# Patient Record
Sex: Female | Born: 2000
Health system: Southern US, Community
[De-identification: ages and names within clinical notes are randomized; demographics above are authoritative.]

## PROBLEM LIST (undated history)

## (undated) DIAGNOSIS — R569 Unspecified convulsions: Secondary | ICD-10-CM

## (undated) DIAGNOSIS — A749 Chlamydial infection, unspecified: Secondary | ICD-10-CM

## (undated) HISTORY — PX: NO PAST SURGERIES: SHX2092

---
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
## 2018-01-13 ENCOUNTER — Ambulatory Visit: Payer: Self-pay | Admitting: Obstetrics

## 2018-01-30 ENCOUNTER — Encounter: Payer: Self-pay | Admitting: Obstetrics

## 2018-01-30 ENCOUNTER — Ambulatory Visit (INDEPENDENT_AMBULATORY_CARE_PROVIDER_SITE_OTHER): Payer: Medicaid Other | Admitting: Obstetrics

## 2018-01-30 ENCOUNTER — Other Ambulatory Visit (HOSPITAL_COMMUNITY)
Admission: RE | Admit: 2018-01-30 | Discharge: 2018-01-30 | Disposition: A | Discharge status: Home / Self Care | Payer: Medicaid Other | Source: Ambulatory Visit | Attending: Obstetrics | Admitting: Obstetrics

## 2018-01-30 VITALS — BP 135/75 | HR 84 | Wt 207.0 lb

## 2018-01-30 DIAGNOSIS — Z113 Encounter for screening for infections with a predominantly sexual mode of transmission: Secondary | ICD-10-CM

## 2018-01-30 DIAGNOSIS — N898 Other specified noninflammatory disorders of vagina: Secondary | ICD-10-CM

## 2018-01-30 DIAGNOSIS — Z3481 Encounter for supervision of other normal pregnancy, first trimester: Secondary | ICD-10-CM | POA: Insufficient documentation

## 2018-01-30 DIAGNOSIS — Z3201 Encounter for pregnancy test, result positive: Secondary | ICD-10-CM

## 2018-01-30 DIAGNOSIS — Z3A12 12 weeks gestation of pregnancy: Secondary | ICD-10-CM

## 2018-01-30 DIAGNOSIS — Z3687 Encounter for antenatal screening for uncertain dates: Secondary | ICD-10-CM

## 2018-01-30 HISTORY — DX: Encounter for supervision of other normal pregnancy, first trimester: Z34.81

## 2018-01-30 LAB — POCT URINE PREGNANCY: Preg Test, Ur: POSITIVE — AB

## 2018-01-30 MED ORDER — PRENATAL GUMMIES/DHA & FA 0.4-32.5 MG PO CHEW: 3.0000 | CHEWABLE_TABLET | Freq: Every day | ORAL | 11 refills | Status: DC

## 2018-01-30 NOTE — Progress Notes (Signed)
Subjective:    Kathy Bell is being seen today for her first obstetrical visit.  This is not a planned pregnancy. She is at 6322w2d gestation. Her obstetrical history is significant for obesity. Relationship with FOB: significant other, not living together. Patient does intend to breast feed. Pregnancy history fully reviewed.  The information documented in the HPI was reviewed and verified.  Menstrual History: OB History    Gravida  1   Para  0   Term  0   Preterm  0   AB  0   Living  0     SAB  0   TAB  0   Ectopic  0   Multiple  0   Live Births  0            Patient's last menstrual period was 10/29/2017 (approximate).    Past Medical History:  Diagnosis Date  . Seizure (HCC)   . Seizures (HCC)     History reviewed. No pertinent surgical history.  (Not in a hospital admission)  No Known Allergies  Social History   Tobacco Use  . Smoking status: Never Smoker  . Smokeless tobacco: Never Used  Substance Use Topics  . Alcohol use: No    Family History  Problem Relation Age of Onset  . Cancer Mother 533       Passed away from breast cancer at 19 years old  . Cancer Maternal Grandmother        Cancer at young age, and then passed away from cancer at 19 years old  . Kidney disease Maternal Grandfather   . Kidney disease Paternal Aunt      Review of Systems Constitutional: negative for weight loss Gastrointestinal: negative for vomiting Genitourinary:negative for genital lesions and vaginal discharge and dysuria Musculoskeletal:negative for back pain Behavioral/Psych: negative for abusive relationship, depression, illegal drug usage and tobacco use    Objective:    BP 135/75   Pulse 84   Wt 207 lb (93.9 kg)   LMP 10/29/2017 (Approximate) Comment: Pt reports it was only two days and it was brown, not red  BMI 36.67 kg/m  General Appearance:    Alert, cooperative, no distress, appears stated age  Head:    Normocephalic, without obvious  abnormality, atraumatic  Eyes:    PERRL, conjunctiva/corneas clear, EOM's intact, fundi    benign, both eyes  Ears:    Normal TM's and external ear canals, both ears  Nose:   Nares normal, septum midline, mucosa normal, no drainage    or sinus tenderness  Throat:   Lips, mucosa, and tongue normal; teeth and gums normal  Neck:   Supple, symmetrical, trachea midline, no adenopathy;    thyroid:  no enlargement/tenderness/nodules; no carotid   bruit or JVD  Back:     Symmetric, no curvature, ROM normal, no CVA tenderness  Lungs:     Clear to auscultation bilaterally, respirations unlabored  Chest Wall:    No tenderness or deformity   Heart:    Regular rate and rhythm, S1 and S2 normal, no murmur, rub   or gallop  Breast Exam:    No tenderness, masses, or nipple abnormality  Abdomen:     Soft, non-tender, bowel sounds active all four quadrants,    no masses, no organomegaly  Genitalia:    Normal female without lesion, discharge or tenderness  Extremities:   Extremities normal, atraumatic, no cyanosis or edema  Pulses:   2+ and symmetric all extremities  Skin:  Skin color, texture, turgor normal, no rashes or lesions  Lymph nodes:   Cervical, supraclavicular, and axillary nodes normal  Neurologic:   CNII-XII intact, normal strength, sensation and reflexes    throughout      Lab Review Urine pregnancy test Labs reviewed yes Radiologic studies reviewed no  Assessment:    Pregnancy at 8078w2d weeks   No FHT's with Doppler.  Informal ultrasound reveals ~ 7 week IUP, FHR 136 bpm   Plan:    Formal ultrasound ordered for dating.  Prenatal vitamins.  Counseling provided regarding continued use of seat belts, cessation of alcohol consumption, smoking or use of illicit drugs; infection precautions i.e., influenza/TDAP immunizations, toxoplasmosis,CMV, parvovirus, listeria and varicella; workplace safety, exercise during pregnancy; routine dental care, safe medications, sexual activity, hot  tubs, saunas, pools, travel, caffeine use, fish and methlymercury, potential toxins, hair treatments, varicose veins Weight gain recommendations per IOM guidelines reviewed: underweight/BMI< 18.5--> gain 28 - 40 lbs; normal weight/BMI 18.5 - 24.9--> gain 25 - 35 lbs; overweight/BMI 25 - 29.9--> gain 15 - 25 lbs; obese/BMI >30->gain  11 - 20 lbs Problem list reviewed and updated. FIRST/CF mutation testing/NIPT/QUAD SCREEN/fragile X/Ashkenazi Jewish population testing/Spinal muscular atrophy discussed: requested. Role of ultrasound in pregnancy discussed; fetal survey: requested. Amniocentesis discussed: not indicated.  No orders of the defined types were placed in this encounter.  Orders Placed This Encounter  Procedures  . Culture, OB Urine  . US OB Comp Less 14 Wks    Standing Status:   Future    Standing Expiration Date:   04/03/2019    Order Specific Question:   Reason for Exam (SYMPTOM  OR DIAGNOSIS REQUIRED)    Answer:   Unsure LMP    Order Specific Question:   Preferred Imaging Location?    Answer:   [facility]  . Hemoglobinopathy evaluation  . Obstetric Panel, Including HIV  . Inheritest(R) CF/SMA Panel  . Genetic Screening  . POCT urine pregnancy    Follow up in 4 weeks. 50% of 25 min visit spent on counseling and coordination of care.     Brock BadHARLES A. Shanikia Kernodle MD 01-30-2018

## 2018-01-31 LAB — CERVICOVAGINAL ANCILLARY ONLY
BACTERIAL VAGINITIS: NEGATIVE
CANDIDA VAGINITIS: NEGATIVE
CHLAMYDIA, DNA PROBE: NEGATIVE
NEISSERIA GONORRHEA: NEGATIVE
TRICH (WINDOWPATH): NEGATIVE

## 2018-02-01 LAB — URINE CULTURE, OB REFLEX

## 2018-02-01 LAB — CULTURE, OB URINE

## 2018-02-05 NOTE — L&D Delivery Note (Signed)
Obstetrical Delivery Note   Date of Delivery:   04/05/2018 Primary OB:   [facility] Gestational Age/EDD: [redacted]w[redacted]d (dated by 8 week ultrasound) Reason for Admission: Preterm labor Antepartum complications: PPROM diagnosed at 16/3 weeks  Delivered By:   Cornelia Copa MD  Delivery Type:   spontaneous vaginal delivery  Delivery Details:   Patient came to [facility] triaged on 2/28 for LOF and dx with PPROM. No s/s of infection or labor so patient discharged to home with outpatient expectant management. Patient came in tonight with abdominal pain and no fetal heart tones noted and feet in the vagina. Patient had uncomplicated vaginal delivery with delivery of the placenta shortly thereafter. Speculum exam negative Anesthesia:    None Intrapartum complications: None GBS:    Not applicable Laceration:    none Episiotomy:    none Rectal exam:   deferred Placenta:    Delivered, intact, and sent to pathology Estimated Blood Loss:   Baby:    Stillborn female, weight not done. Fetus looked appropriately grown for 16 weeks. Grossly normal looking female. +skin sloughing and overlapping sutures  Cornelia Copa MD Attending Center for [facility])

## 2018-02-10 ENCOUNTER — Ambulatory Visit (HOSPITAL_COMMUNITY)
Admission: RE | Admit: 2018-02-10 | Discharge: 2018-02-10 | Disposition: A | Discharge status: Home / Self Care | Payer: Medicaid Other | Source: Ambulatory Visit | Attending: Obstetrics | Admitting: Obstetrics

## 2018-02-10 DIAGNOSIS — Z3687 Encounter for antenatal screening for uncertain dates: Secondary | ICD-10-CM | POA: Diagnosis not present

## 2018-02-10 DIAGNOSIS — Z3481 Encounter for supervision of other normal pregnancy, first trimester: Secondary | ICD-10-CM

## 2018-02-13 LAB — OBSTETRIC PANEL, INCLUDING HIV
Antibody Screen: NEGATIVE
Basophils Absolute: 0 10*3/uL (ref 0.0–0.2)
Basos: 0 %
EOS (ABSOLUTE): 0.1 10*3/uL (ref 0.0–0.4)
Eos: 1 %
HIV Screen 4th Generation wRfx: NONREACTIVE
Hematocrit: 38.3 % (ref 34.0–46.6)
Hemoglobin: 12.5 g/dL (ref 11.1–15.9)
Hepatitis B Surface Ag: NEGATIVE
Immature Grans (Abs): 0 10*3/uL (ref 0.0–0.1)
Immature Granulocytes: 0 %
Lymphocytes Absolute: 2.2 10*3/uL (ref 0.7–3.1)
Lymphs: 25 %
MCH: 27.2 pg (ref 26.6–33.0)
MCHC: 32.6 g/dL (ref 31.5–35.7)
MCV: 83 fL (ref 79–97)
Monocytes Absolute: 0.7 10*3/uL (ref 0.1–0.9)
Monocytes: 8 %
Neutrophils Absolute: 6 10*3/uL (ref 1.4–7.0)
Neutrophils: 66 %
Platelets: 225 10*3/uL (ref 150–450)
RBC: 4.59 x10E6/uL (ref 3.77–5.28)
RDW: 13.4 % (ref 12.3–15.4)
RPR Ser Ql: NONREACTIVE
Rh Factor: POSITIVE
Rubella Antibodies, IGG: 2.65 index (ref >0.99)
WBC: 9 10*3/uL (ref 3.4–10.8)

## 2018-02-13 LAB — HEMOGLOBINOPATHY EVALUATION
HEMOGLOBIN F QUANTITATION: 0 % (ref 0.0–2.0)
HGB C: 0 %
HGB S: 0 %
HGB VARIANT: 0 %
Hemoglobin A2 Quantitation: 2.2 % (ref 1.8–3.2)
Hgb A: 97.8 % (ref 96.4–98.8)

## 2018-02-13 LAB — INHERITEST(R) CF/SMA PANEL

## 2018-02-17 DIAGNOSIS — E669 Obesity, unspecified: Secondary | ICD-10-CM | POA: Diagnosis not present

## 2018-02-17 DIAGNOSIS — Z1388 Encounter for screening for disorder due to exposure to contaminants: Secondary | ICD-10-CM | POA: Diagnosis not present

## 2018-02-17 DIAGNOSIS — Z3009 Encounter for other general counseling and advice on contraception: Secondary | ICD-10-CM | POA: Diagnosis not present

## 2018-02-17 DIAGNOSIS — Z0389 Encounter for observation for other suspected diseases and conditions ruled out: Secondary | ICD-10-CM | POA: Diagnosis not present

## 2018-02-17 DIAGNOSIS — Z3401 Encounter for supervision of normal first pregnancy, first trimester: Secondary | ICD-10-CM | POA: Diagnosis not present

## 2018-02-17 DIAGNOSIS — G40909 Epilepsy, unspecified, not intractable, without status epilepticus: Secondary | ICD-10-CM | POA: Diagnosis not present

## 2018-02-18 ENCOUNTER — Other Ambulatory Visit (HOSPITAL_COMMUNITY): Payer: Self-pay | Admitting: Nurse Practitioner

## 2018-02-18 DIAGNOSIS — Z3682 Encounter for antenatal screening for nuchal translucency: Secondary | ICD-10-CM

## 2018-02-18 DIAGNOSIS — Z3A13 13 weeks gestation of pregnancy: Secondary | ICD-10-CM

## 2018-02-27 ENCOUNTER — Encounter: Payer: Self-pay | Admitting: Certified Nurse Midwife

## 2018-03-04 ENCOUNTER — Encounter (HOSPITAL_COMMUNITY): Payer: Self-pay

## 2018-03-11 ENCOUNTER — Ambulatory Visit (HOSPITAL_COMMUNITY)
Admission: RE | Admit: 2018-03-11 | Discharge: 2018-03-11 | Disposition: A | Discharge status: Home / Self Care | Payer: Medicaid Other | Source: Ambulatory Visit | Attending: Nurse Practitioner | Admitting: Nurse Practitioner

## 2018-03-11 ENCOUNTER — Encounter (HOSPITAL_COMMUNITY): Payer: Self-pay

## 2018-03-11 DIAGNOSIS — Z3A13 13 weeks gestation of pregnancy: Secondary | ICD-10-CM | POA: Diagnosis not present

## 2018-03-11 DIAGNOSIS — G40909 Epilepsy, unspecified, not intractable, without status epilepticus: Secondary | ICD-10-CM | POA: Diagnosis not present

## 2018-03-11 DIAGNOSIS — O99211 Obesity complicating pregnancy, first trimester: Secondary | ICD-10-CM | POA: Diagnosis not present

## 2018-03-11 DIAGNOSIS — Z3682 Encounter for antenatal screening for nuchal translucency: Secondary | ICD-10-CM | POA: Diagnosis not present

## 2018-03-11 DIAGNOSIS — O99350 Diseases of the nervous system complicating pregnancy, unspecified trimester: Secondary | ICD-10-CM | POA: Diagnosis not present

## 2018-03-11 DIAGNOSIS — Z3491 Encounter for supervision of normal pregnancy, unspecified, first trimester: Secondary | ICD-10-CM | POA: Diagnosis not present

## 2018-03-14 ENCOUNTER — Other Ambulatory Visit (HOSPITAL_COMMUNITY): Payer: Self-pay | Admitting: Nurse Practitioner

## 2018-03-19 DIAGNOSIS — E669 Obesity, unspecified: Secondary | ICD-10-CM | POA: Diagnosis not present

## 2018-03-19 DIAGNOSIS — Z3402 Encounter for supervision of normal first pregnancy, second trimester: Secondary | ICD-10-CM | POA: Diagnosis not present

## 2018-03-19 DIAGNOSIS — R824 Acetonuria: Secondary | ICD-10-CM | POA: Diagnosis not present

## 2018-03-19 DIAGNOSIS — G40909 Epilepsy, unspecified, not intractable, without status epilepticus: Secondary | ICD-10-CM | POA: Diagnosis not present

## 2018-03-19 DIAGNOSIS — Z803 Family history of malignant neoplasm of breast: Secondary | ICD-10-CM | POA: Diagnosis not present

## 2018-03-19 DIAGNOSIS — O99320 Drug use complicating pregnancy, unspecified trimester: Secondary | ICD-10-CM | POA: Diagnosis not present

## 2018-04-04 ENCOUNTER — Encounter: Payer: Self-pay | Admitting: Advanced Practice Midwife

## 2018-04-04 ENCOUNTER — Other Ambulatory Visit: Payer: Self-pay

## 2018-04-04 ENCOUNTER — Inpatient Hospital Stay (HOSPITAL_BASED_OUTPATIENT_CLINIC_OR_DEPARTMENT_OTHER): Payer: Medicaid Other

## 2018-04-04 ENCOUNTER — Other Ambulatory Visit: Payer: Self-pay | Admitting: Obstetrics & Gynecology

## 2018-04-04 ENCOUNTER — Inpatient Hospital Stay (HOSPITAL_COMMUNITY)
Admission: EM | Admit: 2018-04-04 | Discharge: 2018-04-04 | Disposition: A | Discharge status: Home / Self Care | Payer: Medicaid Other | Attending: Obstetrics & Gynecology | Admitting: Obstetrics & Gynecology

## 2018-04-04 ENCOUNTER — Encounter (HOSPITAL_COMMUNITY): Payer: Self-pay | Admitting: *Deleted

## 2018-04-04 DIAGNOSIS — O4290 Premature rupture of membranes, unspecified as to length of time between rupture and onset of labor, unspecified weeks of gestation: Secondary | ICD-10-CM | POA: Insufficient documentation

## 2018-04-04 DIAGNOSIS — O4692 Antepartum hemorrhage, unspecified, second trimester: Secondary | ICD-10-CM

## 2018-04-04 DIAGNOSIS — Z3A16 16 weeks gestation of pregnancy: Secondary | ICD-10-CM | POA: Insufficient documentation

## 2018-04-04 DIAGNOSIS — O42912 Preterm premature rupture of membranes, unspecified as to length of time between rupture and onset of labor, second trimester: Secondary | ICD-10-CM

## 2018-04-04 DIAGNOSIS — G40909 Epilepsy, unspecified, not intractable, without status epilepticus: Secondary | ICD-10-CM | POA: Diagnosis not present

## 2018-04-04 DIAGNOSIS — O26892 Other specified pregnancy related conditions, second trimester: Secondary | ICD-10-CM | POA: Diagnosis present

## 2018-04-04 DIAGNOSIS — O99212 Obesity complicating pregnancy, second trimester: Secondary | ICD-10-CM

## 2018-04-04 DIAGNOSIS — O99352 Diseases of the nervous system complicating pregnancy, second trimester: Secondary | ICD-10-CM

## 2018-04-04 DIAGNOSIS — Z3492 Encounter for supervision of normal pregnancy, unspecified, second trimester: Secondary | ICD-10-CM

## 2018-04-04 DIAGNOSIS — O4100X0 Oligohydramnios, unspecified trimester, not applicable or unspecified: Secondary | ICD-10-CM

## 2018-04-04 DIAGNOSIS — Z363 Encounter for antenatal screening for malformations: Secondary | ICD-10-CM

## 2018-04-04 HISTORY — DX: Chlamydial infection, unspecified: A74.9

## 2018-04-04 HISTORY — DX: Oligohydramnios, unspecified trimester, not applicable or unspecified: O41.00X0

## 2018-04-04 HISTORY — DX: Premature rupture of membranes, unspecified as to length of time between rupture and onset of labor, unspecified weeks of gestation: O42.90

## 2018-04-04 NOTE — MAU Note (Signed)
Around 0500, had a warm gush, kept leaking.  About an hour ago when she got up, she noted blood. No pain. Care at [facility].

## 2018-04-04 NOTE — Discharge Instructions (Signed)
Sepsis, Diagnosis, Adult  Sepsis is a serious bodily reaction to an infection. The infection that triggers sepsis may be from a bacteria, virus, or fungus. Sepsis can result from an infection in any part of your body. Infections that commonly lead to sepsis include skin, lung, and urinary tract infections.  Sepsis is a medical emergency that must be treated right away in a hospital. In severe cases, it can lead to septic shock. Septic shock can weaken your heart and cause your blood pressure to drop. This can cause your central nervous system and your body's organs to stop working.  What are the causes?  This condition is caused by a severe reaction to infections from bacteria, viruses, or fungus. The germs that most often lead to sepsis include:  · Escherichia coli (E. coli) bacteria.  · Staphylococcus aureus (staph) bacteria.  · Some types of Streptococcus bacteria.  The most common infections affect these organs:  · The lung (pneumonia).  · The kidneys or bladder (urinary tract infection).  · The skin (cellulitis).  · The bowel, gallbladder, or pancreas.  What increases the risk?  You are more likely to develop this condition if:  · Your body's disease-fighting system (immune system) is weakened.  · You are age 65 or older.  · You are female.  · You had surgery or you have been hospitalized.  · You have these devices inserted into your body:  ? A small, thin tube (catheter).  ? IV line.  ? Breathing tube.  ? Drainage tube.  · You are not getting enough nutrients from food (malnourished).  · You have a long-term (chronic) disease, such as cancer, lung disease, kidney disease, or diabetes.  · You are African American.  What are the signs or symptoms?  Symptoms of this condition may include:  · Fever.  · Chills or feeling very cold.  · Confusion or anxiety.  · Fatigue.  · Muscle aches.  · Shortness of breath.  · Nausea and vomiting.  · Urinating much less than usual.  · Fast heart rate (tachycardia).  · Rapid  breathing (hyperventilation).  · Changes in skin color. Your skin may look blotchy, pale, or blue.  · Cool, clammy, or sweaty skin.  · Skin rash.  Other symptoms depend on the source of your infection.  How is this diagnosed?  This condition is diagnosed based on:  · Your symptoms.  · Your medical history.  · A physical exam.  Other tests may also be done to find out the cause of the infection and how severe the sepsis is. These tests may include:  · Blood tests.  · Urine tests.  · Swabs from other areas of your body that may have an infection. These samples may be tested (cultured) to find out what type of bacteria is causing the infection.  · Chest X-ray to check for pneumonia. Other imaging tests, such as a CT scan, may also be done.  · Lumbar puncture. This removes a small amount of the fluid that surrounds your brain and spinal cord. The fluid is then examined for infection.  How is this treated?  This condition must be treated in a hospital. Based on the cause of your infection, you may be given an antibiotic, antiviral, or antifungal medicine.  You may also receive:  · Fluids through an IV.  · Oxygen and breathing assistance.  · Medicines to increase your blood pressure.  · Kidney dialysis. This process cleans your blood if your   kidneys have failed.  · Surgery to remove infected tissue.  · Blood transfusion if needed.  · Medicine to prevent blood clots.  · Nutrients to correct imbalances in basic body function (metabolism). You may:  ? Receive important salts and minerals (electrolytes) through an IV.  ? Have your blood sugar level adjusted.  Follow these instructions at home:  Medicines    · Take over-the-counter and prescription medicines only as told by your health care provider.  · If you were prescribed an antibiotic, antiviral, or antifungal medicine, take it as told by your health care provider. Do not stop taking the medicine even if you start to feel better.  General instructions  · If you have a  catheter or other indwelling device, ask to have it removed as soon as possible.  · Keep all follow-up visits as told by your health care provider. This is important.  Contact a health care provider if:  · You do not feel like you are getting better or regaining strength.  · You are having trouble coping with your recovery.  · You frequently feel tired.  · You feel worse or do not seem to get better after surgery.  · You think you may have an infection after surgery.  Get help right away if:  · You have any symptoms of sepsis.  · You have difficulty breathing.  · You have a rapid or skipping heartbeat.  · You become confused or disoriented.  · You have a high fever.  · Your skin becomes blotchy, pale, or blue.  · You have an infection that is getting worse or not getting better.  These symptoms may represent a serious problem that is an emergency. Do not wait to see if the symptoms will go away. Get medical help right away. Call your local emergency services (911 in the [place] Do not drive yourself to the hospital.  Summary  · Sepsis is a medical emergency that requires immediate treatment in a hospital.  · This condition is caused by a severe reaction to infections from bacteria, viruses, or fungus.  · Based on the cause of your infection, you may be given an antibiotic, antiviral, or antifungal medicine.  · Treatment may also include IV fluids, breathing assistance, and kidney dialysis.  This information is not intended to replace advice given to you by your health care provider. Make sure you discuss any questions you have with your health care provider.  Document Released: 10/21/2002 Document Revised: 08/30/2017 Document Reviewed: 08/30/2017  [employer]vier Interactive Patient Education © 2019 [employer]

## 2018-04-04 NOTE — MAU Provider Note (Addendum)
History     CSN: [id number]  Arrival date and time: 04/04/18 3704   First Provider Initiated Contact with Patient 04/04/18 1032      Chief Complaint  Patient presents with  . Vaginal Discharge  . Vaginal Bleeding   HPI   Kathy Bell is 18 y.o. G1P0000 female [redacted]w[redacted]d who presents with large gush of fluid at 0500 this morning. She thought she had urinated on herself. She has not had any leaking since. However, a few hours later when she wiped after using the bathroom she noticed bloody mucus on the toilet paper. No blood in the toilet bowl. She denies changes in abdominal pain; has had persistent round ligament pain for the past couple of weeks. Has not yet had her anatomy scan.   OB History    Gravida  1   Para  0   Term  0   Preterm  0   AB  0   Living  0     SAB  0   TAB  0   Ectopic  0   Multiple  0   Live Births  0           Past Medical History:  Diagnosis Date  . Chlamydia   . Seizure (HCC)   . Seizures (HCC)    epileptic, no seizure or meds in ~78yrs    Past Surgical History:  Procedure Laterality Date  . NO PAST SURGERIES      Family History  Problem Relation Age of Onset  . Cancer Mother 46       Passed away from breast cancer at 20 years old  . Cancer Maternal Grandmother        Cancer at young age, and then passed away from cancer at 20 years old  . Healthy Father   . Kidney disease Maternal Grandfather   . Kidney disease Paternal Aunt     Social History   Tobacco Use  . Smoking status: Never Smoker  . Smokeless tobacco: Never Used  Substance Use Topics  . Alcohol use: No  . Drug use: No    Allergies: No Known Allergies  Medications Prior to Admission  Medication Sig Dispense Refill Last Dose  . Prenatal MV-Min-FA-Omega-3 (PRENATAL GUMMIES/DHA & FA) 0.4-32.5 MG CHEW Chew 3 tablets by mouth daily. 90 tablet 11 Taking    Review of Systems  Constitutional: Negative for chills and fever.  Gastrointestinal: Negative for  abdominal pain, diarrhea, nausea and vomiting.  Genitourinary: Positive for vaginal bleeding and vaginal discharge. Negative for difficulty urinating, dysuria, hematuria and pelvic pain.  Musculoskeletal: Negative for back pain.   Physical Exam   Blood pressure 124/64, pulse 92, temperature 98.4 F (36.9 C), temperature source Oral, resp. rate 18, last menstrual period 10/29/2017.  Physical Exam  Constitutional: She is oriented to person, place, and time. She appears well-developed and well-nourished. No distress.  HENT:  Head: Normocephalic and atraumatic.  Eyes: Conjunctivae and EOM are normal.  Neck: Normal range of motion. Neck supple.  Cardiovascular: Normal rate and regular rhythm.  Respiratory: Effort normal and breath sounds normal. No respiratory distress.  GI: Soft. She exhibits no distension. There is no abdominal tenderness. There is no rebound and no guarding.  Musculoskeletal: Normal range of motion.  Neurological: She is alert and oriented to person, place, and time.  Skin: Skin is warm and dry. She is not diaphoretic.  Psychiatric: She has a normal mood and affect. Her behavior is normal.   FHT:  146 bpm   MAU Course/MDM  Procedures  Patient Vitals for the past 24 hrs:  BP Temp Temp src Pulse Resp  04/04/18 1020 124/64 98.4 F (36.9 C) Oral 92 18    .  Assessment and Plan    De Hollingshead 04/04/2018, 10:32 AM    Care assumed from Dr. Earlene Plater at 1032.  Reita Cliche, CNM MDM:  --Ultrasound results reviewed with Dr. Judeth Cornfield (MFM) and Dr. Debroah Loop (Faculty Attending) --Signs of sepsis and possible outcomes discussed at length with patient  Patient Vitals for the past 24 hrs:  BP Temp Temp src Pulse Resp  04/04/18 1430 138/63 - - 97 -  04/04/18 1020 124/64 98.4 F (36.9 C) Oral 92 18    [place] Mfm Ob Detail +14 Wk  Result Date: 04/04/2018 ----------------------------------------------------------------------  OBSTETRICS REPORT                       (Signed  Final 04/04/2018 11:52 am) ---------------------------------------------------------------------- Patient Info  ID #:       [id number]                          D.O.B.:  Jun 20, 2000 (17 yrs)  Name:       Kathy Bell                Visit Date: 04/04/2018 10:28 am ---------------------------------------------------------------------- Performed By  Performed By:     Tomma Lightning             Ref. Address:     [address]                    [facility]                                                             [address]                                                             [address]                                                             [place]                                                             [address]  Attending:        Noralee Space MD        [facility]  Referred By:      Center for                    [facility]                    [facility] - [facility] ---------------------------------------------------------------------- Orders   #  Description                          Code         Ordered By   1  [place] MFM OB DETAIL +14 WK              [id number]     Marcy Siren  ----------------------------------------------------------------------   #  Order #                    Accession #                 Episode #   1  [id number]                  [id number]                  [id number]  ---------------------------------------------------------------------- Indications   Vaginal bleeding in pregnancy, second          O46.92   trimester   [redacted] weeks gestation of pregnancy                Z3A.16   Seizure disorder                               O99.350 G40.909   Obesity complicating pregnancy, second         O99.212   trimester   Encounter for antenatal screening for          Z36.3   malformations  ---------------------------------------------------------------------- Vital  Signs  Weight (lb): 203                               Height:        5'3"  BMI:         35.96 ---------------------------------------------------------------------- Fetal Evaluation  Num Of Fetuses:         1  Fetal Heart Rate(bpm):  152  Cardiac Activity:       Observed  Presentation:           Transverse, head to maternal right  Placenta:               Anterior, low-lying, 0.6 cm from int os  P. Cord Insertion:      Not well visualized  Amniotic Fluid  AFI FV:      Anhydramnios ---------------------------------------------------------------------- Biometry  BPD:      33.7  mm     G. Age:  16w 3d         48  %    CI:        70.18   %    70 - 86  FL/HC:      17.4   %    13.3 - 16.5  HC:      128.3  mm     G. Age:  16w 4d         43  %    HC/AC:      1.24        1.05 - 1.39  AC:      103.7  mm     G. Age:  16w 3d         48  %    FL/BPD:     66.2   %  FL:       22.3  mm     G. Age:  16w 5d         55  %    FL/AC:      21.5   %    20 - 24  HUM:        21  mm     G. Age:  16w 2d         51  %  CER:      15.2  mm     G. Age:  15w 1d         22  %  LV:        5.5  mm  CM:        5.1  mm  Est. FW:     159  gm      0 lb 6 oz     63  % ---------------------------------------------------------------------- OB History  Gravidity:    1         Term:   0        Prem:   0        SAB:   0  TOP:          0       Ectopic:  0        Living: 0 ---------------------------------------------------------------------- Gestational Age  LMP:           22w 3d        Date:  10/29/17                 EDD:   08/05/18  U/S Today:     16w 4d                                        EDD:   09/15/18  Best:          16w 3d     Det. By:  Marcella Dubs         EDD:   09/16/18                                      (02/10/18) ---------------------------------------------------------------------- Anatomy  Cranium:               Appears normal         Aortic Arch:            Not well visualized   Cavum:                 Appears normal         Ductal Arch:  Appears normal  Ventricles:            Appears normal         Diaphragm:              Not well visualized  Choroid Plexus:        Appears normal         Stomach:                Appears normal, left                                                                        sided  Cerebellum:            Appears normal         Abdomen:                Appears normal  Posterior Fossa:       Appears normal         Abdominal Wall:         Not well visualized  Nuchal Fold:           Not well visualized    Cord Vessels:           Appears normal (3                                                                        vessel cord)  Face:                  Not well visualized    Kidneys:                Appear normal  Lips:                  Not well visualized    Bladder:                Appears normal  Palate:                Not well visualized    Spine:                  Not well visualized  Heart:                 Not well visualized    Upper Extremities:      RT Vis; Lt R/U not                                                                        well vis  RVOT:                  Not well visualized    Lower Extremities:  RT Vis; Lt T/F not                                                                        well vis  LVOT:                  Not well visualized  Other:  Technically difficult due to low amniotic fluid and fetal position. ---------------------------------------------------------------------- Cervix Uterus Adnexa  Cervix  Length:            3.5  cm.  Normal appearance by transabdominal scan.  Uterus  No abnormality visualized.  Left Ovary  Within normal limits.  Right Ovary  Within normal limits. ---------------------------------------------------------------------- Impression  Patient is being evaluated in the MAU for c/o leakage of  amniotic fluid.  On ultrasound, no measurable pocket of amniotic fluid is  seen (anhydramnios). Fetal biometry  is consistent with her  previously-established dates and good fetal heart activity is  seen. Fetal anatomical survey is very limited because of  anhydramnios.Placenta is anterior and low lying.  Impression: Very early PPROM.  Very early PPROM can be associated with pulmonary  hypoplasia and poor neonatal outcome. She has an  increased risk of miscarriage. Role of antibiotics is not clear  and is not indicated if she does not have infection now.  Patient has an option of termination of pregnancy. If she is  planning to continue her pregnancy, weekly fetal heat checks  at your office is sufficient. Recommend readmission at 23 to  24 weeks for steroids and management till delivery. ----------------------------------------------------------------------                  Noralee Space, MD Electronically Signed Final Report   04/04/2018 11:52 am ----------------------------------------------------------------------   A/P: --18 y.o. G1P0000 at [redacted]w[redacted]d  --PROM, Anhydramnios --FHT 146 by Doppler --Discharge home in stable condition  F/U:  --Per consult with Dr. Debroah Loop, patient to have ultrasound next week,  --[facility] messaged to schedule visit no more than two weeks --[place] ordered, patient knows to expect call. [facility] clinic messaged to schedule visit  Clayton Bibles, [place] 04/04/18  3:06 PM

## 2018-04-05 ENCOUNTER — Inpatient Hospital Stay (HOSPITAL_COMMUNITY)
Admission: AD | Admit: 2018-04-05 | Discharge: 2018-04-06 | DRG: 779 | Disposition: A | Discharge status: Home / Self Care | Payer: Medicaid Other | Attending: Obstetrics and Gynecology | Admitting: Obstetrics and Gynecology

## 2018-04-05 DIAGNOSIS — Z3A16 16 weeks gestation of pregnancy: Secondary | ICD-10-CM | POA: Diagnosis not present

## 2018-04-05 DIAGNOSIS — O42112 Preterm premature rupture of membranes, onset of labor more than 24 hours following rupture, second trimester: Secondary | ICD-10-CM

## 2018-04-05 DIAGNOSIS — O42012 Preterm premature rupture of membranes, onset of labor within 24 hours of rupture, second trimester: Secondary | ICD-10-CM | POA: Diagnosis not present

## 2018-04-05 DIAGNOSIS — O411220 Chorioamnionitis, second trimester, not applicable or unspecified: Secondary | ICD-10-CM | POA: Diagnosis not present

## 2018-04-05 DIAGNOSIS — O42919 Preterm premature rupture of membranes, unspecified as to length of time between rupture and onset of labor, unspecified trimester: Secondary | ICD-10-CM | POA: Diagnosis present

## 2018-04-05 DIAGNOSIS — O021 Missed abortion: Principal | ICD-10-CM | POA: Diagnosis present

## 2018-04-05 LAB — CBC
HEMATOCRIT: 36.1 % (ref 36.0–46.0)
Hemoglobin: 12 g/dL (ref 12.0–15.0)
MCH: 28.6 pg (ref 26.0–34.0)
MCHC: 33.2 g/dL (ref 30.0–36.0)
MCV: 86.2 fL (ref 80.0–100.0)
Platelets: 174 10*3/uL (ref 150–400)
RBC: 4.19 MIL/uL (ref 3.87–5.11)
RDW: 12.8 % (ref 11.5–15.5)
WBC: 16 10*3/uL — ABNORMAL HIGH (ref 4.0–10.5)
nRBC: 0 % (ref 0.0–0.2)

## 2018-04-05 LAB — TYPE AND SCREEN
ABO/RH(D): B POS
Antibody Screen: NEGATIVE

## 2018-04-05 MED ORDER — LACTATED RINGERS IV SOLN: 500.0000 mL | INTRAVENOUS | Status: DC | PRN

## 2018-04-05 MED ORDER — METHYLERGONOVINE MALEATE 0.2 MG/ML IJ SOLN
0.2000 mg | Freq: Once | INTRAMUSCULAR | Status: AC
  Administered 2018-04-05: 0.2 mg via INTRAMUSCULAR
  Filled 2018-04-05: qty 1

## 2018-04-05 MED ORDER — LACTATED RINGERS IV SOLN
INTRAVENOUS | Status: DC
  Administered 2018-04-06: 01:00:00 via INTRAVENOUS

## 2018-04-05 MED ORDER — FENTANYL CITRATE (PF) 100 MCG/2ML IJ SOLN
50.0000 ug | INTRAMUSCULAR | Status: DC | PRN
  Administered 2018-04-05: 50 ug via INTRAVENOUS
  Filled 2018-04-05: qty 2

## 2018-04-05 NOTE — MAU Provider Note (Signed)
Chief Complaint: Abdominal Pain   None     SUBJECTIVE HPI: Kathy Bell is a 18 y.o. G1P0000 at [redacted]w[redacted]d who presents to maternity admissions reporting increasing lower abdominal pain and pelvic pressure. Pain is intermittent, cramping, worsening over time, radiates to her low back.  She was diagnosed with previable PPROM on 04/04/18.  There are no other symptoms. She has not tried any treatments.    HPI  Past Medical History:  Diagnosis Date  . Chlamydia   . Seizure (HCC)   . Seizures (HCC)    epileptic, no seizure or meds in ~35yrs   Past Surgical History:  Procedure Laterality Date  . NO PAST SURGERIES     Social History   Socioeconomic History  . Marital status: Single    Spouse name: Not on file  . Number of children: Not on file  . Years of education: 82  . Highest education level: Not on file  Occupational History  . 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 Financial resource strain: Very hard  . Food insecurity:    Worry: Sometimes true    Inability: Often true  . Transportation needs:    Medical: Yes    Non-medical: Yes  Tobacco Use  . Smoking status: Never Smoker  . Smokeless tobacco: Never Used  Substance and Sexual Activity  . Alcohol use: No  . Drug use: No  . Sexual activity: Yes    Partners: Male  Lifestyle  . Physical activity:    Days per week: 6 days    Minutes per session: 150+ min  . Stress: To some extent  Relationships  . Social connections:    Talks on phone: More than three times a week    Gets together: Three times a week    Attends religious service: More than 4 times per year    Active member of club or organization: Yes    Attends meetings of clubs or organizations: More than 4 times per year    Relationship status: Living with partner  . Intimate partner violence:    Fear of current or ex partner: No    Emotionally abused: No    Physically abused: No    Forced sexual activity: No   Other Topics Concern  . Not on file  Social History Narrative  . Not on file   No current facility-administered medications on file prior to encounter.    Current Outpatient Medications on File Prior to Encounter  Medication Sig Dispense Refill  . Prenatal MV-Min-FA-Omega-3 (PRENATAL GUMMIES/DHA & FA) 0.4-32.5 MG CHEW Chew 3 tablets by mouth daily. 90 tablet 11   No Known Allergies  ROS:  Review of Systems  Constitutional: Negative for chills, fatigue and fever.  Respiratory: Negative for shortness of breath.   Cardiovascular: Negative for chest pain.  Gastrointestinal: Positive for abdominal pain.  Genitourinary: Positive for pelvic pain, vaginal bleeding and vaginal discharge. Negative for difficulty urinating, dysuria, flank pain and vaginal pain.  Neurological: Negative for dizziness and headaches.  Psychiatric/Behavioral: Negative.      I have reviewed patient's Past Medical Hx, Surgical Hx, Family Hx, Social Hx, medications and allergies.   Physical Exam  No data found. Constitutional: Well-developed, well-nourished female in no acute distress.  Cardiovascular: normal rate Respiratory: normal effort GI: Abd soft, non-tender. Pos BS x 4 MS: Extremities nontender, no edema, normal ROM Neurologic: Alert and oriented x 4.  GU: Neg CVAT.  PELVIC  EXAM: SSE with fetal head noted through cervix.  SVE with fetal head and most of torso through open cervix.     LAB RESULTS No results found for this or any previous visit (from the past 24 hour(s)).  B/Positive/-- (12/26 1344)  IMAGING   MAU Management/MDM: Orders Placed This Encounter  Procedures  . CBC  . RPR  . Type and screen [facility]    Meds ordered this encounter  Medications  . lactated ringers infusion  . lactated ringers infusion 500-1,000 mL    Discussed findings with pt and family who responded with appropriate mourning for loss.  Pt admitted for imminent previable delivery.  IV  started and IV pain medications ordered.     ASSESSMENT 1. Preterm premature rupture of membranes (PPROM) with onset of labor after 24 hours of rupture in second trimester, antepartum   2. Preterm labor in second trimester with preterm delivery in second trimester, single or unspecified fetus     PLAN Admit to HROB Unit LR, Fentanyl    Sharen Counter Certified Nurse-Midwife 04/05/2018  10:41 PM

## 2018-04-05 NOTE — MAU Note (Signed)
Pt called MAU and had questions about follow up and discharge papers. Advised pt that if she has any increased pain, bleeding, s/s of infection to come back to MAU for evaluation. Pt verbalized understanding and was appreciative of information.

## 2018-04-05 NOTE — H&P (Addendum)
Obstetrics & Gynecology H&P   Date of Admission: 04/05/2018   Requesting Provider: MAU  Primary OBGYN: [facility] Primary Care Provider: Leland Her  Reason for Admission: PTL, PPROM  History of Present Illness: Kathy Bell is a 18 y.o. G1P0010 (Patient's last menstrual period was 10/29/2017 (approximate).), with the above CC. PMHx is significant for nothing.  Pt dx with pprom yesterday and no s/s of labor or infection so exp management as outpatient done. Pt came in with more pain and CNM exam felt feet in the vagina and unable to get heart tones.    I delivered the baby and the placenta w/o issue.     ROS: A 12-point review of systems was performed and negative, except as stated in the above HPI.  OBGYN History: As per HPI. OB History  Gravida Para Term Preterm AB Living  1 0 0 0 0 0  SAB TAB Ectopic Multiple Live Births  0 0 0 0 0    # Outcome Date GA Lbr Len/2nd Weight Sex Delivery Anes PTL Lv  1 Current               Past Medical History: Past Medical History:  Diagnosis Date  . Chlamydia   . Seizure (HCC)   . Seizures (HCC)    epileptic, no seizure or meds in ~31yrs    Past Surgical History: Past Surgical History:  Procedure Laterality Date  . NO PAST SURGERIES      Family History:  Family History  Problem Relation Age of Onset  . Cancer Mother 24       Passed away from breast cancer at 20 years old  . Cancer Maternal Grandmother        Cancer at young age, and then passed away from cancer at 20 years old  . Healthy Father   . Kidney disease Maternal Grandfather   . Kidney disease Paternal Aunt     Social History:  Social History   Socioeconomic History  . Marital status: Single    Spouse name: Not on file  . Number of children: Not on file  . Years of education: 28  . Highest education level: Not on file  Occupational History  . 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 Financial resource strain: Very  hard  . Food insecurity:    Worry: Sometimes true    Inability: Often true  . Transportation needs:    Medical: Yes    Non-medical: Yes  Tobacco Use  . Smoking status: Never Smoker  . Smokeless tobacco: Never Used  Substance and Sexual Activity  . Alcohol use: No  . Drug use: No  . Sexual activity: Yes    Partners: Male  Lifestyle  . Physical activity:    Days per week: 6 days    Minutes per session: 150+ min  . Stress: To some extent  Relationships  . Social connections:    Talks on phone: More than three times a week    Gets together: Three times a week    Attends religious service: More than 4 times per year    Active member of club or organization: Yes    Attends meetings of clubs or organizations: More than 4 times per year    Relationship status: Living with partner  . Intimate partner violence:    Fear of current or ex partner: No    Emotionally abused: No    Physically  abused: No    Forced sexual activity: No  Other Topics Concern  . Not on file  Social History Narrative  . Not on file    Allergy: No Known Allergies  Current Outpatient Medications: Medications Prior to Admission  Medication Sig Dispense Refill Last Dose  . Prenatal MV-Min-FA-Omega-3 (PRENATAL GUMMIES/DHA & FA) 0.4-32.5 MG CHEW Chew 3 tablets by mouth daily. 90 tablet 11 Taking     Hospital Medications: Current Facility-Administered Medications  Medication Dose Route Frequency Provider Last Rate Last Dose  . fentaNYL (SUBLIMAZE) injection 50 mcg  50 mcg Intravenous Q1H PRN Chester Bing, MD      . lactated ringers infusion 500-1,000 mL  500-1,000 mL Intravenous PRN Leftwich-Kirby, Wilmer Floor, CNM      . lactated ringers infusion   Intravenous Continuous Leftwich-Kirby, Wilmer Floor, CNM      . methylergonovine (METHERGINE) injection 0.2 mg  0.2 mg Intramuscular Once [place] Bing, MD         Physical Exam: See flowsheet There is no height or weight on file to calculate BMI. General  appearance: Well nourished, well developed female in no acute distress.  Respiratory:  . Normal respiratory effort Abdomen: nd, nttp Neuro/Psych:  Normal mood and affect.  Skin:  Warm and dry.  Extremities: no clubbing, cyanosis, or edema.   Pelvic exam: negative after placental delivery.   Laboratory:  Recent Labs  Lab 04/05/18 2237  WBC 16.0*  HGB 12.0  HCT 36.1  PLT 174   Pending: RPR  Assessment: Kathy Bell is a 18 y.o. G1P0010 s/p SVD in OB triage. Pt doing well  Plan: Admit overnight. Placenta to pathology. Will get uds, gc/ct, tsh, hiv. Likely home tomorrow.   Cornelia Copa MD Attending Center for [facility])

## 2018-04-05 NOTE — MAU Note (Signed)
Pt c/o increased pain. PPROM 2 days ago. Assisted pt to bed

## 2018-04-06 ENCOUNTER — Encounter (HOSPITAL_COMMUNITY): Payer: Self-pay | Admitting: *Deleted

## 2018-04-06 DIAGNOSIS — O42919 Preterm premature rupture of membranes, unspecified as to length of time between rupture and onset of labor, unspecified trimester: Secondary | ICD-10-CM

## 2018-04-06 DIAGNOSIS — Z3A16 16 weeks gestation of pregnancy: Secondary | ICD-10-CM | POA: Diagnosis not present

## 2018-04-06 DIAGNOSIS — O42012 Preterm premature rupture of membranes, onset of labor within 24 hours of rupture, second trimester: Secondary | ICD-10-CM | POA: Diagnosis not present

## 2018-04-06 DIAGNOSIS — O021 Missed abortion: Secondary | ICD-10-CM | POA: Diagnosis present

## 2018-04-06 DIAGNOSIS — O42912 Preterm premature rupture of membranes, unspecified as to length of time between rupture and onset of labor, second trimester: Secondary | ICD-10-CM | POA: Diagnosis present

## 2018-04-06 HISTORY — DX: Preterm premature rupture of membranes, unspecified as to length of time between rupture and onset of labor, unspecified trimester: O42.919

## 2018-04-06 LAB — HIV ANTIBODY (ROUTINE TESTING W REFLEX): HIV Screen 4th Generation wRfx: NONREACTIVE

## 2018-04-06 LAB — ABO/RH: ABO/RH(D): B POS

## 2018-04-06 LAB — RPR: RPR Ser Ql: NONREACTIVE

## 2018-04-06 LAB — TSH: TSH: 1.016 u[IU]/mL (ref 0.350–4.500)

## 2018-04-06 MED ORDER — ACETAMINOPHEN 325 MG PO TABS: 650.0000 mg | ORAL_TABLET | ORAL | Status: DC | PRN

## 2018-04-06 MED ORDER — OXYTOCIN BOLUS FROM INFUSION: 500.0000 mL | Freq: Once | INTRAVENOUS | Status: DC

## 2018-04-06 MED ORDER — ONDANSETRON HCL 4 MG/2ML IJ SOLN: 4.0000 mg | Freq: Four times a day (QID) | INTRAMUSCULAR | Status: DC | PRN

## 2018-04-06 MED ORDER — OXYTOCIN 40 UNITS IN NORMAL SALINE INFUSION - SIMPLE MED: 2.5000 [IU]/h | INTRAVENOUS | Status: DC

## 2018-04-06 MED ORDER — DIBUCAINE 1 % RE OINT: 1.0000 "application " | TOPICAL_OINTMENT | RECTAL | Status: DC | PRN

## 2018-04-06 MED ORDER — ONDANSETRON HCL 4 MG PO TABS: 4.0000 mg | ORAL_TABLET | ORAL | Status: DC | PRN

## 2018-04-06 MED ORDER — OXYCODONE-ACETAMINOPHEN 5-325 MG PO TABS: 2.0000 | ORAL_TABLET | ORAL | Status: DC | PRN

## 2018-04-06 MED ORDER — TETANUS-DIPHTH-ACELL PERTUSSIS 5-2.5-18.5 LF-MCG/0.5 IM SUSP: 0.5000 mL | Freq: Once | INTRAMUSCULAR | Status: DC

## 2018-04-06 MED ORDER — SOD CITRATE-CITRIC ACID 500-334 MG/5ML PO SOLN: 30.0000 mL | ORAL | Status: DC | PRN

## 2018-04-06 MED ORDER — IBUPROFEN 600 MG PO TABS
600.0000 mg | ORAL_TABLET | Freq: Four times a day (QID) | ORAL | Status: DC
  Administered 2018-04-06 (×2): 600 mg via ORAL
  Filled 2018-04-06 (×2): qty 1

## 2018-04-06 MED ORDER — DIPHENHYDRAMINE HCL 25 MG PO CAPS: 25.0000 mg | ORAL_CAPSULE | Freq: Four times a day (QID) | ORAL | Status: DC | PRN

## 2018-04-06 MED ORDER — OXYCODONE-ACETAMINOPHEN 5-325 MG PO TABS: 1.0000 | ORAL_TABLET | ORAL | Status: DC | PRN

## 2018-04-06 MED ORDER — ONDANSETRON HCL 4 MG/2ML IJ SOLN: 4.0000 mg | INTRAMUSCULAR | Status: DC | PRN

## 2018-04-06 MED ORDER — LIDOCAINE HCL (PF) 1 % IJ SOLN: 30.0000 mL | INTRAMUSCULAR | Status: DC | PRN

## 2018-04-06 MED ORDER — FENTANYL CITRATE (PF) 100 MCG/2ML IJ SOLN: 100.0000 ug | INTRAMUSCULAR | Status: DC | PRN

## 2018-04-06 MED ORDER — BENZOCAINE-MENTHOL 20-0.5 % EX AERO: 1.0000 "application " | INHALATION_SPRAY | CUTANEOUS | Status: DC | PRN

## 2018-04-06 MED ORDER — ACETAMINOPHEN 325 MG PO TABS
650.0000 mg | ORAL_TABLET | ORAL | Status: DC | PRN
  Administered 2018-04-06: 650 mg via ORAL
  Filled 2018-04-06: qty 2

## 2018-04-06 MED ORDER — SIMETHICONE 80 MG PO CHEW: 80.0000 mg | CHEWABLE_TABLET | ORAL | Status: DC | PRN

## 2018-04-06 MED ORDER — WITCH HAZEL-GLYCERIN EX PADS: 1.0000 "application " | MEDICATED_PAD | CUTANEOUS | Status: DC | PRN

## 2018-04-06 MED ORDER — IBUPROFEN 600 MG PO TABS: 600.0000 mg | ORAL_TABLET | Freq: Four times a day (QID) | ORAL | 0 refills | Status: DC

## 2018-04-06 MED ORDER — OXYCODONE HCL 5 MG PO TABS
5.0000 mg | ORAL_TABLET | Freq: Four times a day (QID) | ORAL | Status: DC | PRN
  Administered 2018-04-06: 5 mg via ORAL
  Filled 2018-04-06: qty 1

## 2018-04-06 MED ORDER — COCONUT OIL OIL: 1.0000 "application " | TOPICAL_OIL | Status: DC | PRN

## 2018-04-06 MED ORDER — ZOLPIDEM TARTRATE 5 MG PO TABS: 5.0000 mg | ORAL_TABLET | Freq: Every evening | ORAL | Status: DC | PRN

## 2018-04-06 NOTE — Discharge Summary (Signed)
Obstetrical Discharge Summary  Date of Admission: 04/05/2018 Date of Discharge: 04/06/2018  Primary OB: [facility]  Gestational Age at Delivery: [redacted]w[redacted]d   Antepartum complications: PPROM at 16/3 weeks Reason for Admission: PTL Date of Delivery: 04/05/2018  Delivered By: Cornelia Copa MD Delivery Type: spontaneous vaginal delivery Intrapartum complications/course: None Anesthesia: none Placenta: Delivered and expressed. Intact: yes. To pathology: yes.  Laceration: none Episiotomy: none EBL: Baby: Stillborn female, grossly normal (see delivery note).    Discharge Diagnosis: Delivered.   Postpartum course: Uncomplicated.  Labs: negative HIV, TSH, GC/CT, RPR. B positive Pending: placenta  Discharge Vital Signs:  Current Vital Signs 24h Vital Sign Ranges  T 98.7 F (37.1 C) Temp  Avg: 99 F (37.2 C)  Min: 98.5 F (36.9 C)  Max: 99.9 F (37.7 C)  BP (!) 124/51 BP  Min: 124/51  Max: 137/51  HR (!) 103 Pulse  Avg: 101.7  Min: 88  Max: 112  RR 17 Resp  Avg: 18.3  Min: 17  Max: 20  SaO2 100 %   SpO2  Avg: 99.3 %  Min: 98 %  Max: 100 %       24 Hour I/O Current Shift I/O  Time Ins Outs 02/29 0701 - 03/01 0700 In: -  Out: 375 [Urine:375] No intake/output data recorded.   Discharge Exam:  General: NAD Abdomen: soft  Recent Labs  Lab 04/05/18 2237  WBC 16.0*  HGB 12.0  HCT 36.1  PLT 174    Disposition: Home  Rh Immune globulin given: not applicable  Contraception: not discussed with patient   Plan:  Kathy Bell was discharged to home in good condition. Patient leaning towards cremation and burial of remains.  Follow-up appointment with [facility] in 10 week for a mood check visit  Future Appointments  Date Time Provider Department Center  04/14/2018 11:30 AM [facility] [place] 1 [facility]  04/14/2018  1:30 PM Hermina Staggers, MD [facility] None    Discharge Medications: Allergies as of 04/06/2018   No Known Allergies     Medication List    TAKE these  medications   acetaminophen 325 MG tablet Commonly known as:  TYLENOL Take 2 tablets (650 mg total) by mouth every 4 (four) hours as needed (for pain scale < 4).   ibuprofen 600 MG tablet Commonly known as:  ADVIL,MOTRIN Take 1 tablet (600 mg total) by mouth every 6 (six) hours.   PRENATAL GUMMIES/DHA & FA 0.4-32.5 MG Chew Chew 3 tablets by mouth daily.       Cornelia Copa MD Attending Center for [facility])

## 2018-04-06 NOTE — Discharge Instructions (Signed)
Vaginal Delivery, Care After Refer to this sheet in the next few weeks. These discharge instructions provide you with information on caring for yourself after delivery. Your caregiver may also give you specific instructions. Your treatment has been planned according to the most current medical practices available, but problems sometimes occur. Call your caregiver if you have any problems or questions after you go home. HOME CARE INSTRUCTIONS 1. Take over-the-counter or prescription medicines only as directed by your caregiver or pharmacist. 2. Do not drink alcohol, especially if you are breastfeeding or taking medicine to relieve pain. 3. Do not smoke tobacco. 4. Continue to use good perineal care. Good perineal care includes: 1. Wiping your perineum from back to front 2. Keeping your perineum clean. 3. You can do sitz baths twice a day, to help keep this area clean 5. Do not use tampons, douche or have sex until your caregiver says it is okay. 6. Shower only and avoid sitting in submerged water, aside from sitz baths 7. Wear a well-fitting bra that provides breast support. 8. Eat healthy foods. 9. Drink enough fluids to keep your urine clear or pale yellow. 10. Eat high-fiber foods such as whole grain cereals and breads, brown rice, beans, and fresh fruits and vegetables every day. These foods may help prevent or relieve constipation. 11. Avoid constipation with high fiber foods or medications, such as miralax or metamucil 12. Follow your caregiver's recommendations regarding resumption of activities such as climbing stairs, driving, lifting, exercising, or traveling. 13. Talk to your caregiver about resuming sexual activities. Resumption of sexual activities is dependent upon your risk of infection, your rate of healing, and your comfort and desire to resume sexual activity. 14. Try to have someone help you with your household activities and your newborn for at least a few days after you leave  the hospital. 15. Rest as much as possible. Try to rest or take a nap when your newborn is sleeping. 16. Increase your activities gradually. 17. Keep all of your scheduled postpartum appointments. It is very important to keep your scheduled follow-up appointments. At these appointments, your caregiver will be checking to make sure that you are healing physically and emotionally. SEEK MEDICAL CARE IF:   You are passing large clots from your vagina. Save any clots to show your caregiver.  You have a foul smelling discharge from your vagina.  You have trouble urinating.  You are urinating frequently.  You have pain when you urinate.  You have a change in your bowel movements.  You have increasing redness, pain, or swelling near your vaginal incision (episiotomy) or vaginal tear.  You have pus draining from your episiotomy or vaginal tear.  Your episiotomy or vaginal tear is separating.  You have painful, hard, or reddened breasts.  You have a severe headache.  You have blurred vision or see spots.  You feel sad or depressed.  You have thoughts of hurting yourself or your newborn.  You have questions about your care, the care of your newborn, or medicines.  You are dizzy or light-headed.  You have a rash.  You have nausea or vomiting.  You were breastfeeding and have not had a menstrual period within 12 weeks after you stopped breastfeeding.  You are not breastfeeding and have not had a menstrual period by the 12th week after delivery.  You have a fever. SEEK IMMEDIATE MEDICAL CARE IF:   You have persistent pain.  You have chest pain.  You have shortness of breath.    You faint.  You have leg pain.  You have stomach pain.  Your vaginal bleeding saturates two or more sanitary pads in 1 hour. MAKE SURE YOU:   Understand these instructions.  Will watch your condition.  Will get help right away if you are not doing well or get worse. Document Released:  01/20/2000 Document Revised: 06/08/2013 Document Reviewed: 09/19/2011 ExitCare Patient Information 2015 [employer]. This information is not intended to replace advice given to you by your health care provider. Make sure you discuss any questions you have with your health care provider.  Sitz Bath A sitz bath is a warm water bath taken in the sitting position. The water covers only the hips and butt (buttocks). We recommend using one that fits in the toilet, to help with ease of use and cleanliness. It may be used for either healing or cleaning purposes. Sitz baths are also used to relieve pain, itching, or muscle tightening (spasms). The water may contain medicine. Moist heat will help you heal and relax.  HOME CARE  Take 3 to 4 sitz baths a day. 18. Fill the bathtub half-full with warm water. 19. Sit in the water and open the drain a little. 20. Turn on the warm water to keep the tub half-full. Keep the water running constantly. 21. Soak in the water for 15 to 20 minutes. 22. After the sitz bath, pat the affected area dry. GET HELP RIGHT AWAY IF: You get worse instead of better. Stop the sitz baths if you get worse. MAKE SURE YOU:  Understand these instructions.  Will watch your condition.  Will get help right away if you are not doing well or get worse. Document Released: 03/01/2004 Document Revised: 10/17/2011 Document Reviewed: 05/22/2010 ExitCare Patient Information 2015 [employer]. This information is not intended to replace advice given to you by your health care provider. Make sure you discuss any questions you have with your health care provider.    

## 2018-04-06 NOTE — Progress Notes (Signed)
Comfort Care completed and Comfort box given to mother and significant other.

## 2018-04-06 NOTE — MAU Note (Signed)
Dr. Vergie Living at bedside. Pt pushing .  Double footing Breech presentation noted. Pt nauseated and vomited aprox 300 cc yellow emesis. Cleaned patient up and continued to assist in breathing and pushing.  2324 complete delivery of fetus. No signs of life. Pt wanting to hold baby. S.O. and family at bedside for support. Pt grieving appropriately. Post partum checks started after placenta out at 2335.

## 2018-04-06 NOTE — Progress Notes (Signed)
Chaplain responded to a page for support for a patient and family for fetal demise. Chaplin offer spiritual support and prayer. Chaplin encourage the family to think of the baby as a living spirit and to allow the baby to live through their thoughts and prayers. Chaplain let family know that chaplain is available for follow up.    04/06/18 0000  Clinical Encounter Type  Visited With Patient and family together  Visit Type Critical Care;Death  Referral From Nurse  Spiritual Encounters  Spiritual Needs Prayer;Emotional

## 2018-04-07 LAB — GC/CHLAMYDIA PROBE AMP (~~LOC~~) NOT AT ARMC
Chlamydia: NEGATIVE
Neisseria Gonorrhea: NEGATIVE

## 2018-04-14 ENCOUNTER — Ambulatory Visit (HOSPITAL_COMMUNITY): Payer: Medicaid Other

## 2018-04-14 ENCOUNTER — Ambulatory Visit: Payer: Medicaid Other | Admitting: Obstetrics

## 2018-04-14 ENCOUNTER — Encounter: Payer: Medicaid Other | Admitting: Obstetrics and Gynecology

## 2018-04-16 ENCOUNTER — Ambulatory Visit: Payer: Medicaid Other | Admitting: Obstetrics

## 2018-04-21 ENCOUNTER — Ambulatory Visit (INDEPENDENT_AMBULATORY_CARE_PROVIDER_SITE_OTHER): Payer: Medicaid Other | Admitting: Obstetrics

## 2018-04-21 ENCOUNTER — Other Ambulatory Visit: Payer: Self-pay

## 2018-04-21 ENCOUNTER — Encounter: Payer: Self-pay | Admitting: Obstetrics

## 2018-04-21 VITALS — BP 134/85 | HR 82 | Resp 16 | Ht 63.0 in | Wt 201.2 lb

## 2018-04-21 DIAGNOSIS — O039 Complete or unspecified spontaneous abortion without complication: Secondary | ICD-10-CM | POA: Diagnosis not present

## 2018-04-21 DIAGNOSIS — O99345 Other mental disorders complicating the puerperium: Secondary | ICD-10-CM

## 2018-04-21 DIAGNOSIS — F53 Postpartum depression: Secondary | ICD-10-CM | POA: Diagnosis not present

## 2018-04-21 NOTE — Progress Notes (Signed)
Patient ID: Kathy Bell, female   DOB: 18-Aug-2000, 18 y.o.   MRN: [id number]  Chief Complaint  Patient presents with  . Follow-up    Mood check    HPI Kathy Bell is a 18 y.o. female.  Patient presents for 2 week follow up after PPROM and SAB at [redacted] weeks gestation.  No complaints.  Still feeling appropriately a little sad, but feels that she is dealing with the situation.  She does request counseling. HPI  Past Medical History:  Diagnosis Date  . Chlamydia   . Seizure ([facility])   . Seizures ([facility])    epileptic, no seizure or meds in ~73yrs    Past Surgical History:  Procedure Laterality Date  . NO PAST SURGERIES      Family History  Problem Relation Age of Onset  . Cancer Mother 46       Passed away from breast cancer at 20 years old  . Cancer Maternal Grandmother        Cancer at young age, and then passed away from cancer at 20 years old  . Healthy Father   . Kidney disease Maternal Grandfather   . Kidney disease Paternal Aunt     Social History Social History   Tobacco Use  . Smoking status: Never Smoker  . Smokeless tobacco: Never Used  Substance Use Topics  . Alcohol use: No  . Drug use: No    No Known Allergies  Current Outpatient Medications  Medication Sig Dispense Refill  . acetaminophen (TYLENOL) 325 MG tablet Take 2 tablets (650 mg total) by mouth every 4 (four) hours as needed (for pain scale < 4).    . ibuprofen (ADVIL,MOTRIN) 600 MG tablet Take 1 tablet (600 mg total) by mouth every 6 (six) hours. 30 tablet 0  . Prenatal MV-Min-FA-Omega-3 (PRENATAL GUMMIES/DHA & FA) 0.4-32.5 MG CHEW Chew 3 tablets by mouth daily. 90 tablet 11   No current facility-administered medications for this visit.     Review of Systems Review of Systems Constitutional: negative for fatigue and weight loss Respiratory: negative for cough and wheezing Cardiovascular: negative for chest pain, fatigue and palpitations Gastrointestinal: negative for abdominal pain and  change in bowel habits Genitourinary:negative Integument/breast: negative for nipple discharge Musculoskeletal:negative for myalgias Neurological: negative for gait problems and tremors Behavioral/Psych: positive for appropriate sadness after SAB Endocrine: negative for temperature intolerance      Blood pressure 134/85, pulse 82, resp. rate 16, height 5\' 3"  (1.6 m), weight 201 lb 3.2 oz (91.3 kg), last menstrual period 10/29/2017.  Physical Exam Physical Exam:  Deferred  50% of 15 min visit spent on counseling and coordination of care.   Data Reviewed Labs Pathology  Assessment     1. SAB (spontaneous abortion) after PPROM at [redacted] weeks gestation - doing well  2. Postpartum depression Rx: - Ambulatory referral to [facility]    Plan    Follow up in 4 weeks  Orders Placed This Encounter  Procedures  . Ambulatory referral to [facility]    Referral Priority:   Routine    Referral Type:   Consultation    Referral Reason:   Specialty Services Required    Number of Visits Requested:   1   No orders of the defined types were placed in this encounter.    Brock Bad MD 05-01-2018

## 2018-05-19 ENCOUNTER — Ambulatory Visit: Payer: Medicaid Other | Admitting: Obstetrics

## 2018-06-05 DIAGNOSIS — F99 Mental disorder, not otherwise specified: Secondary | ICD-10-CM | POA: Diagnosis not present

## 2018-07-17 ENCOUNTER — Ambulatory Visit: Payer: Medicaid Other | Admitting: Obstetrics

## 2018-08-19 ENCOUNTER — Ambulatory Visit
Admission: EM | Admit: 2018-08-19 | Discharge: 2018-08-19 | Disposition: A | Discharge status: Home / Self Care | Payer: Medicaid Other | Attending: Family Medicine | Admitting: Family Medicine

## 2018-08-19 ENCOUNTER — Encounter: Payer: Self-pay | Admitting: Emergency Medicine

## 2018-08-19 ENCOUNTER — Other Ambulatory Visit: Payer: Self-pay

## 2018-08-19 DIAGNOSIS — R1030 Lower abdominal pain, unspecified: Secondary | ICD-10-CM

## 2018-08-19 DIAGNOSIS — M545 Low back pain, unspecified: Secondary | ICD-10-CM

## 2018-08-19 DIAGNOSIS — R110 Nausea: Secondary | ICD-10-CM | POA: Diagnosis not present

## 2018-08-19 LAB — POCT URINALYSIS DIP (MANUAL ENTRY)
Bilirubin, UA: NEGATIVE
Glucose, UA: NEGATIVE mg/dL
Ketones, POC UA: NEGATIVE mg/dL
Leukocytes, UA: NEGATIVE
Nitrite, UA: NEGATIVE
Spec Grav, UA: >=1.03 — AB (ref 1.010–1.025)
Urobilinogen, UA: 0.2 E.U./dL
pH, UA: 6 (ref 5.0–8.0)

## 2018-08-19 LAB — POCT URINE PREGNANCY: Preg Test, Ur: NEGATIVE

## 2018-08-19 MED ORDER — IBUPROFEN 800 MG PO TABS: 800.0000 mg | ORAL_TABLET | Freq: Three times a day (TID) | ORAL | 0 refills | Status: DC

## 2018-08-19 MED ORDER — KETOROLAC TROMETHAMINE 60 MG/2ML IM SOLN
60.0000 mg | Freq: Once | INTRAMUSCULAR | Status: AC
  Administered 2018-08-19: 60 mg via INTRAMUSCULAR

## 2018-08-19 MED ORDER — ONDANSETRON 4 MG PO TBDP
4.0000 mg | ORAL_TABLET | Freq: Once | ORAL | Status: AC
  Administered 2018-08-19: 4 mg via ORAL

## 2018-08-19 NOTE — ED Notes (Signed)
Patient able to ambulate independently  

## 2018-08-19 NOTE — ED Provider Notes (Signed)
[facility]   [id number] 08/19/18 Arrival Time: 1330  ASSESSMENT & PLAN:  1. Lower abdominal pain   2. Acute bilateral low back pain without sciatica   3. Nausea without vomiting    UPT negative. Urine without signs of infection. Hematuria secondary to current menstrual cycle starting today. Responded well to IM Toradol and Zofran-ODT. Feeling better and prefers to go home and rest.  Benign abdominal exam. No indications for urgent abdominal/pelvic imaging at this time. Discussed.  Meds ordered this encounter  Medications  . ketorolac (TORADOL) injection 60 mg  . ondansetron (ZOFRAN-ODT) disintegrating tablet 4 mg  . ibuprofen (ADVIL) 800 MG tablet    Sig: Take 1 tablet (800 mg total) by mouth 3 (three) times daily with meals.    Dispense:  21 tablet    Refill:  0     Discharge Instructions     You have been seen today for abdominal pain. Your evaluation was not suggestive of any emergent condition requiring medical intervention at this time. However, some abdominal problems make take more time to appear. Therefore, it is very important for you to pay attention to any new symptoms or worsening of your current condition.  Please return here or to the Emergency Department immediately should you begin to feel worse in any way or have any of the following symptoms: increasing or different abdominal pain, persistent vomiting, inability to drink fluids, fevers, or shaking chills.      Follow-up Information    [facility].   Specialty: Emergency Medicine Why: If symptoms worsen in any way. Contact information: [address] [id number] [place] [id number] [phone]          Reviewed expectations re: course of current medical issues. Questions answered. Outlined signs and symptoms indicating need for more acute intervention. Patient verbalized understanding. After Visit Summary given.    SUBJECTIVE: History from: patient. Kathy Bell is a 18 y.o. female who presents with complaint of persistent lower abdominal discomfort. Onset abrupt, today. Has noticed mild to moderate lower back discomfort over the past several days; 'usually feel that right before my period starts'. Reports menstrual cycle has been regular for the past few months; miscarriage in 03/2018. Reports she is one week late but vaginal bleeding started this morning. Abdominal discomfort described as aching and cramping; without radiation. Symptoms are unchanged since beginning. Fever: absent. Aggravating factors: have not been identified. Alleviating factors: have not been identified. Associated symptoms: fatigue. She denies arthralgias, belching, chills, constipation, diarrhea, dysuria, headache, myalgias and sweats. Does report nausea with 1-2 episodes of emesis; 'not a lot'. Appetite: decreased. PO intake: decreased. Ambulatory without assistance. Urinary symptoms: none. Bowel movements: have not significantly changed; last bowel movement within the past 1-2 days and without blood. OTC treatment: none.  Patient's last menstrual period was 08/19/2018.   Past Surgical History:  Procedure Laterality Date  . NO PAST SURGERIES      ROS: As per HPI. All other systems negative.  OBJECTIVE:  Vitals:   08/19/18 1344  BP: (!) 136/94  Pulse: 90  Resp: 18  Temp: 98.7 F (37.1 C)  TempSrc: Oral  SpO2: 92%    General appearance: alert, oriented, no acute distress  HEENT: [place]; AT Lungs: clear to auscultation bilaterally; unlabored respirations Heart: regular rate and rhythm Abdomen: soft; without distention; mild and poorly localized tenderness over lower abdomen, R>L; normal bowel sounds; without masses or organomegaly; without guarding or rebound tenderness Back: without CVA tenderness;  FROM at waist Extremities: without LE edema; symmetrical; without gross deformities Skin: warm and dry Neurologic: normal  gait Psychological: alert and cooperative; normal mood and affect  Labs: Results for orders placed or performed during the hospital encounter of 08/19/18  POCT urine pregnancy  Result Value Ref Range   Preg Test, Ur Negative Negative  POCT urinalysis dipstick  Result Value Ref Range   Color, UA yellow yellow   Clarity, UA clear clear   Glucose, UA negative negative mg/dL   Bilirubin, UA negative negative   Ketones, POC UA negative negative mg/dL   Spec Grav, UA >=1.030 (A) 1.010 - 1.025   Blood, UA moderate (A) negative   pH, UA 6.0 5.0 - 8.0   Protein Ur, POC trace (A) negative mg/dL   Urobilinogen, UA 0.2 0.2 or 1.0 E.U./dL   Nitrite, UA Negative Negative   Leukocytes, UA Negative Negative   Labs Reviewed  POCT URINALYSIS DIP (MANUAL ENTRY) - Abnormal; Notable for the following components:      Result Value   Spec Grav, UA >=1.030 (*)    Blood, UA moderate (*)    Protein Ur, POC trace (*)    All other components within normal limits  POCT URINE PREGNANCY - Normal     No Known Allergies                                             Past Medical History:  Diagnosis Date  . Chlamydia   . Seizure ([place])   . Seizures ([place])    epileptic, no seizure or meds in ~109yrs   Social History   Socioeconomic History  . Marital status: Single    Spouse name: Not on file  . Number of children: Not on file  . Years of education: 55  . Highest education level: Not on file  Occupational History  . Occupation: Best boy: [employer]    Comment: [place]  . Financial resource strain: Very hard  . Food insecurity    Worry: Sometimes true    Inability: Often true  . Transportation needs    Medical: Yes    Non-medical: Yes  Tobacco Use  . Smoking status: Never Smoker  . Smokeless tobacco: Never Used  Substance and Sexual Activity  . Alcohol use: No  . Drug use: No  . Sexual activity: Yes    Partners: Male  Lifestyle  . Physical activity    Days per  week: 6 days    Minutes per session: 150+ min  . Stress: To some extent  Relationships  . Social connections    Talks on phone: More than three times a week    Gets together: Three times a week    Attends religious service: More than 4 times per year    Active member of club or organization: Yes    Attends meetings of clubs or organizations: More than 4 times per year    Relationship status: Living with partner  . Intimate partner violence    Fear of current or ex partner: No    Emotionally abused: No    Physically abused: No    Forced sexual activity: No  Other Topics Concern  . Not on file  Social History Narrative  . Not on file   Family History  Problem Relation Age of Onset  .  Cancer Mother [id number]       Passed away from breast cancer at 20 years old  . Cancer Maternal Grandmother        Cancer at young age, and then passed away from cancer at 18 years old  . Healthy Father   . Kidney disease Maternal Grandfather   . Kidney disease Paternal Renato GailsAunt      Miyah Hampshire, MD 08/20/18 [phone]

## 2018-08-19 NOTE — Discharge Instructions (Signed)

## 2018-08-19 NOTE — ED Notes (Signed)
MD aware of patient's pain.  Awaiting pregnancy test.

## 2018-08-19 NOTE — ED Triage Notes (Signed)
Pt presents to [facility] for assessment after having a miscarriage in February.  Patient states she has been having back pain x 1 week, and is concerned she may be having another miscarriage.  Pt was 1 week late for her cycle.  Also states a hx of epilepsy, and has the nausea and fatigue associated with that.

## 2019-01-09 DIAGNOSIS — Z20828 Contact with and (suspected) exposure to other viral communicable diseases: Secondary | ICD-10-CM | POA: Diagnosis not present

## 2019-02-06 NOTE — L&D Delivery Note (Signed)
OB/GYN Faculty Practice Delivery Note  Kathy Bell is a 19 y.o. [id number] s/p NSVD at [redacted]w[redacted]d. She was admitted for preterm labor.   ROM: 06/05/2019 at 1341 with clear fluid GBS Status: unknown    Labor Progress: . Patient arrived at 6 cm dilation. She was managed expectantly and progressed to complete.  Delivery Date/Time: 06/05/2019 at 1359  Delivery: Discussed with patient at length previability of infant at this gestational age after she reached full dilation. After consultation with NICU we began pushing with NICU in attendance per her request. Fetus delivered breech and en caul, howeversubsequently developed a fetal head entrapment. At this point Dr. Alysia Penna was called to the room to assess. AROM was performed, and despite attempts at extraction the fetus remained entrapped. The patient was given buccal cytotec and started on high dose pitocin at 6u. Approximately 15 minutes later I returned to the room as patient described a feeling a pressure, and the head delivered with ease with maternal expulsive efforts. There were no signs of life at the time of delivery. Fetus delivered intact and without obvious deformity. Approximately 2cc of cord blood were collected. At that point spontaneous cord lengthening was noted and the placenta delivered with maternal expulsive efforts. Although it initially appeared intact, on fundal massage several pieces of membrane were extruded. Subsequently a sweep of the lower uterine segment produced the remainder of membranes, after which there was no further bleeding and fundus was firm. Perineum inspected with no lacerations. Fetus was dried, wrapped and given to mother to hold.   Placenta: 3v to pathology Complications: fetal head entrapment Lacerations: none EBL: 100 cc Analgesia: epidural   Infant: APGAR (1 MIN):  0 APGAR (5 MINS): 0  APGAR (10 MINS): 0  Weight: 235 grams  Zack Seal, MD/[facility] OB/GYN Fellow, Faculty Practice

## 2019-02-10 DIAGNOSIS — Z113 Encounter for screening for infections with a predominantly sexual mode of transmission: Secondary | ICD-10-CM | POA: Diagnosis not present

## 2019-02-10 DIAGNOSIS — Z124 Encounter for screening for malignant neoplasm of cervix: Secondary | ICD-10-CM | POA: Diagnosis not present

## 2019-02-10 DIAGNOSIS — N3000 Acute cystitis without hematuria: Secondary | ICD-10-CM | POA: Diagnosis not present

## 2019-02-10 DIAGNOSIS — Z118 Encounter for screening for other infectious and parasitic diseases: Secondary | ICD-10-CM | POA: Diagnosis not present

## 2019-02-10 DIAGNOSIS — Z209 Contact with and (suspected) exposure to unspecified communicable disease: Secondary | ICD-10-CM | POA: Diagnosis not present

## 2019-02-18 DIAGNOSIS — A549 Gonococcal infection, unspecified: Secondary | ICD-10-CM | POA: Diagnosis not present

## 2019-02-18 DIAGNOSIS — A749 Chlamydial infection, unspecified: Secondary | ICD-10-CM | POA: Diagnosis not present

## 2019-03-13 DIAGNOSIS — N9489 Other specified conditions associated with female genital organs and menstrual cycle: Secondary | ICD-10-CM | POA: Diagnosis not present

## 2019-03-13 DIAGNOSIS — G40909 Epilepsy, unspecified, not intractable, without status epilepticus: Secondary | ICD-10-CM | POA: Diagnosis not present

## 2019-03-13 DIAGNOSIS — Z3687 Encounter for antenatal screening for uncertain dates: Secondary | ICD-10-CM | POA: Diagnosis not present

## 2019-03-13 DIAGNOSIS — Z23 Encounter for immunization: Secondary | ICD-10-CM | POA: Diagnosis not present

## 2019-03-25 ENCOUNTER — Ambulatory Visit: Payer: Medicaid Other

## 2019-03-25 DIAGNOSIS — O09899 Supervision of other high risk pregnancies, unspecified trimester: Secondary | ICD-10-CM | POA: Insufficient documentation

## 2019-03-25 MED ORDER — BLOOD PRESSURE KIT DEVI: 1.0000 | 0 refills | Status: DC | PRN

## 2019-03-25 NOTE — Progress Notes (Signed)
..    Virtual Visit via Telephone Note  I connected with Kathy Bell on 03/25/19 at  1:30 PM EST by telephone and verified that I am speaking with the correct person using two identifiers.  Location:[facility] Patient: Kathy Bell    I discussed the limitations, risks, security and privacy concerns of performing an evaluation and management service by telephone and the availability of in person appointments. I also discussed with the patient that there may be a patient responsible charge related to this service. The patient expressed understanding and agreed to proceed.   History of Present Illness: PRENATAL INTAKE SUMMARY  Kathy Bell presents today New OB Nurse Interview.  OB History    Gravida  2   Para  1   Term  0   Preterm  0   AB  0   Living  0     SAB  0   TAB  0   Ectopic  0   Multiple  0   Live Births  0          I have reviewed the patient's medical, obstetrical, social, and family histories, medications, and available lab results.  SUBJECTIVE She has no unusual complaints   Observations/Objective: Initial nurse interview for history/labs (New OB)  EDD: 10-22-19 GA: G2P0 GP: G2P0  GENERAL APPEARANCE: alert, well appearing  Assessment and Plan: Normal pregnancy Hx of Epilepsy, no medications, does not see a specialist Pt had 16 wk delivery on 04-05-18 r/t PROM. [facility].  Follow Up Instructions:   I discussed the assessment and treatment plan with the patient. The patient was provided an opportunity to ask questions and all were answered. The patient agreed with the plan and demonstrated an understanding of the instructions.   The patient was advised to call back or seek an in-person evaluation if the symptoms worsen or if the condition fails to improve as anticipated.  I provided 15 minutes of non-face-to-face time during this encounter.   Katrina Stack, RN

## 2019-03-25 NOTE — Progress Notes (Signed)
Patient ID: Kathy Bell, female   DOB: 26-Aug-2000, 19 y.o.   MRN: [id number] Patient seen and assessed by nursing staff during this encounter. I have reviewed the chart and agree with the documentation and plan.  Scheryl Darter, MD 03/25/2019 2:39 PM

## 2019-04-02 ENCOUNTER — Other Ambulatory Visit: Payer: Self-pay

## 2019-04-02 ENCOUNTER — Encounter: Payer: Self-pay | Admitting: Obstetrics

## 2019-04-02 ENCOUNTER — Ambulatory Visit (INDEPENDENT_AMBULATORY_CARE_PROVIDER_SITE_OTHER): Payer: Medicaid Other | Admitting: Certified Nurse Midwife

## 2019-04-02 ENCOUNTER — Encounter: Payer: Self-pay | Admitting: Certified Nurse Midwife

## 2019-04-02 ENCOUNTER — Other Ambulatory Visit (HOSPITAL_COMMUNITY)
Admission: RE | Admit: 2019-04-02 | Discharge: 2019-04-02 | Disposition: A | Discharge status: Home / Self Care | Payer: Medicaid Other | Source: Ambulatory Visit | Attending: Certified Nurse Midwife | Admitting: Certified Nurse Midwife

## 2019-04-02 VITALS — BP 128/74 | HR 99 | Wt 211.6 lb

## 2019-04-02 DIAGNOSIS — O99210 Obesity complicating pregnancy, unspecified trimester: Secondary | ICD-10-CM | POA: Insufficient documentation

## 2019-04-02 DIAGNOSIS — O09891 Supervision of other high risk pregnancies, first trimester: Secondary | ICD-10-CM | POA: Diagnosis not present

## 2019-04-02 DIAGNOSIS — O09211 Supervision of pregnancy with history of pre-term labor, first trimester: Secondary | ICD-10-CM | POA: Diagnosis not present

## 2019-04-02 DIAGNOSIS — O09899 Supervision of other high risk pregnancies, unspecified trimester: Secondary | ICD-10-CM | POA: Insufficient documentation

## 2019-04-02 DIAGNOSIS — N760 Acute vaginitis: Secondary | ICD-10-CM | POA: Diagnosis not present

## 2019-04-02 DIAGNOSIS — B9689 Other specified bacterial agents as the cause of diseases classified elsewhere: Secondary | ICD-10-CM

## 2019-04-02 DIAGNOSIS — Z3481 Encounter for supervision of other normal pregnancy, first trimester: Secondary | ICD-10-CM | POA: Diagnosis not present

## 2019-04-02 DIAGNOSIS — Z8669 Personal history of other diseases of the nervous system and sense organs: Secondary | ICD-10-CM | POA: Diagnosis not present

## 2019-04-02 DIAGNOSIS — G4089 Other seizures: Secondary | ICD-10-CM

## 2019-04-02 DIAGNOSIS — O99351 Diseases of the nervous system complicating pregnancy, first trimester: Secondary | ICD-10-CM | POA: Diagnosis not present

## 2019-04-02 DIAGNOSIS — Z3A11 11 weeks gestation of pregnancy: Secondary | ICD-10-CM | POA: Diagnosis not present

## 2019-04-02 DIAGNOSIS — O99211 Obesity complicating pregnancy, first trimester: Secondary | ICD-10-CM

## 2019-04-02 MED ORDER — ASPIRIN EC 81 MG PO TBEC: 81.0000 mg | DELAYED_RELEASE_TABLET | Freq: Every day | ORAL | 0 refills | Status: DC

## 2019-04-02 NOTE — Progress Notes (Signed)
History:   Kathy Bell is a 19 y.o. G2P0000 at 66w0dby LMP being seen today for her first obstetrical visit.  Her obstetrical history is significant for hx of PTD @ 16 weeks d/t PPROM. Patient does intend to breast feed. Pregnancy history fully reviewed.  Patient reports no complaints.     HISTORY: OB History  Gravida Para Term Preterm AB Living  2 1 0 0 0 0  SAB TAB Ectopic Multiple Live Births  0 0 0 0 0    # Outcome Date GA Lbr Len/2nd Weight Sex Delivery Anes PTL Lv  2 Current           1 Para 04/05/18 114w4d 00:09  M Vag-Spont None  FD     Name: Sagraves,PENDINGBABY FD     Apgar1: 0  Apgar5: 0     Past Medical History:  Diagnosis Date  . Anhydramnios 04/04/2018  . Chlamydia   . Encounter for supervision of other normal pregnancy, first trimester 01/30/2018    Nursing Staff Provider Office Location  [facility] Dating   LMP: 10/29/17 Language   English Anatomy [place] Flu Vaccine   11/13/17 Genetic Screen  NIPS:   AFP:   First Screen:  Quad:   TDaP vaccine    Hgb A1C or  GTT Early  Third trimester  Rhogam     LAB RESULTS  Feeding Plan  Breast Blood Type    Contraception  Undecided Antibody   Circumcision  Yes Rubella   Pediatrician   Undecided RPR Non Reactive   . Preterm premature rupture of membranes (PPROM) with unknown onset of labor 04/06/2018  . PROM (premature rupture of membranes) 04/04/2018  . [place]  [place]   epileptic, no seizure or meds in ~3y52yr. Vaginal delivery 04/06/2018   Past Surgical History:  Procedure Laterality Date  . NO PAST SURGERIES     Family History  Problem Relation Age of Onset  . Cancer Mother 33 107    Passed away from breast cancer at 33 68ars old  . Cancer Maternal Grandmother        Cancer at young age, and then passed away from cancer at 50 75ars old  . Healthy Father   . Kidney disease Maternal Grandfather   . Kidney disease Paternal Aunt    Social History   Tobacco Use  . Smoking status: Never Smoker  .  Smokeless tobacco: Never Used  Substance Use Topics  . Alcohol use: No  . Drug use: No   No Known Allergies Current Outpatient Medications on File Prior to Visit  Medication Sig Dispense Refill  . Blood Pressure Monitoring (BLOOD PRESSURE KIT) DEVI 1 kit by Does not apply route as needed. 1 each 0  . Prenatal MV-Min-FA-Omega-3 (PRENATAL GUMMIES/DHA & FA) 0.4-32.5 MG CHEW Chew 3 tablets by mouth daily. 90 tablet 11  . acetaminophen (TYLENOL) 325 MG tablet Take 2 tablets (650 mg total) by mouth every 4 (four) hours as needed (for pain scale < 4). (Patient not taking: Reported on 03/25/2019)    . ibuprofen (ADVIL) 800 MG tablet Take 1 tablet (800 mg total) by mouth 3 (three) times daily with meals. (Patient not taking: Reported on 03/25/2019) 21 tablet 0   No current facility-administered medications on file prior to visit.    Review of Systems Pertinent items noted in HPI and remainder of comprehensive ROS otherwise negative. Physical Exam:   Vitals:   04/02/19  1328  BP: 128/74  Pulse: 99  Weight: 211 lb 9.6 oz (96 kg)   Fetal Heart Rate (bpm): US-150 System: General: well-developed, well-nourished female in no acute distress   Breasts:  normal appearance, no masses or tenderness bilaterally   Skin: normal coloration and turgor, no rashes   Neurologic: oriented, normal, negative, normal mood   Extremities: normal strength, tone, and muscle mass, ROM of all joints is normal   HEENT PERRLA, extraocular movement intact and sclera clear, anicteric   Mouth/Teeth mucous membranes moist, pharynx normal without lesions and dental hygiene good   Neck supple and no masses   Cardiovascular: regular rate and rhythm   Respiratory:  no respiratory distress, normal breath sounds   Abdomen: soft, non-tender; bowel sounds normal; no masses,  no organomegaly   Bedside Ultrasound for FHR check: Patient informed that the ultrasound is considered a limited obstetric ultrasound and is not intended to  be a complete ultrasound exam.  Patient also informed that the ultrasound is not being completed with the intent of assessing for fetal or placental anomalies or any pelvic abnormalities.  Explained that the purpose of today's ultrasound is to assess for fetal heart rate.  Patient acknowledges the purpose of the exam and the limitations of the study.    FHR 150 by bedside US    Assessment:    Pregnancy: G2P0000 Patient Active Problem List   Diagnosis Date Noted  . Hx of preterm delivery, currently pregnant 04/02/2019  . Obesity during pregnancy, antepartum 04/02/2019  . Supervision of other high risk pregnancy, antepartum 03/25/2019  . Family history of breast cancer 09/06/2017  . Hx of seizure disorder 02/21/2016  . Abnormal uterine bleeding 02/21/2016  . Contraceptive management 02/21/2016     Plan:    1. Supervision of other high risk pregnancy, antepartum - Welcomed back to practice and introduced self to patient  - High risk pregnancy based on history  - Reviewed safety, visitor policy, reassurance about COVID-19 for pregnancy at this time. Discussed possible changes to visits, including televisits, that may occur due to COVID-19.  The office remains open if pt needs to be seen and [facility]U is open 24 hours/day for OB emergencies. - Anticipatory guidance on upcoming appointments, [place] and screening  - Obstetric Panel, Including HIV - Culture, OB Urine - Genetic Screening - Babyscripts Schedule Optimization - Enroll Patient in Babyscripts - Cervicovaginal ancillary only( [place])  2. Hx of preterm delivery, currently pregnant - Patient reports hx of PPROM with delivery at 16.2 weeks last year  - Educated and discussed high risk pregnancy based on hx  - Discussed with patient cervical length [place] around 16 weeks for assessment and educated on 17P during pregnancy  - Patient request 17P during pregnancy, patient reports being very nervous and anxious for the pregnancy and does not  want the same occurrence to happen.  - [place] MFM OB Transvaginal; Future  3. Hx of seizure disorder - no medication and denies seizures in over 3 years   4. Obesity during pregnancy, antepartum - BMI 37  - HgB A1c - aspirin EC 81 MG tablet; Take 1 tablet (81 mg total) by mouth daily. Take after 12 weeks for prevention of preeclampsia later in pregnancy  Dispense: 300 tablet; Refill: 0   Initial labs drawn. Continue prenatal vitamins. Genetic Screening discussed, NIPS: ordered. Ultrasound discussed; fetal anatomic survey: requested. Problem list reviewed and updated. The nature of [place] with multiple MDs and  other Advanced Practice Providers was explained to patient; also emphasized that residents, students are part of our team. Routine obstetric precautions reviewed. Return in about 5 weeks (around 05/07/2019) for HROB with MD/17P injection .     Lajean Manes, [place] for [employer], [place]

## 2019-04-02 NOTE — Patient Instructions (Addendum)
Hydroxyprogesterone caproate injection for pregnancy What is this medicine? HYDROXYPROGESTERONE (hye drox ee proe JES ter one) is a female hormone. This medicine is used in women who are pregnant and who have delivered a baby too early (preterm) in the past. It helps lower the risk of having a preterm baby again. This medicine may be used for other purposes; ask your health care provider or pharmacist if you have questions. COMMON BRAND NAME(S): Makena What should I tell my health care provider before I take this medicine? They need to know if you have any of these conditions:  breast, cervical, uterine, or vaginal cancer  depression  diabetes or prediabetes  heart disease  high blood pressure  history of blood clots  kidney disease  liver disease  lung or breathing disease, like asthma  migraine headaches  seizures  vaginal bleeding  an unusual or allergic reaction to hydroxyprogesterone, other hormones, castor oil, benzyl alcohol, other medicines, foods, dyes, or preservatives  breast-feeding How should I use this medicine? This medicine is for injection into a muscle or under the skin. You will receive an injection once every week (every 7 days) as directed during your pregnancy. It is given by a health care professional in a hospital or clinic setting. Talk to your pediatrician regarding the use of this medicine in children. While this drug may be prescribed for pregnant women as young as 16 years, precautions do apply. Overdosage: If you think you have taken too much of this medicine contact a poison control center or emergency room at once. NOTE: This medicine is only for you. Do not share this medicine with others. What if I miss a dose? It is important not to miss your dose. Call your doctor or health care professional if you are unable to keep an appointment. What may interact with this medicine? Significant interactions are not expected. This list may not  describe all possible interactions. Give your health care provider a list of all the medicines, herbs, non-prescription drugs, or dietary supplements you use. Also tell them if you smoke, drink alcohol, or use illegal drugs. Some items may interact with your medicine. What should I watch for while using this medicine? Your pregnancy will be monitored carefully while you are receiving this medicine. What side effects may I notice from receiving this medicine? Side effects that you should report to your doctor or health care professional as soon as possible:  allergic reactions like skin rash, itching or hives, swelling of the face, lips, or tongue  breathing problems  depressed mood  increase in blood pressure  increased hunger or thirst  increased urination  signs and symptoms of a blood clot such as breathing problems; changes in vision; chest pain; severe, sudden headache; pain, swelling, warmth in the leg; trouble speaking; sudden numbness or weakness of the face, arm or leg  unusually weak or tired  unusual vaginal bleeding  yellowing of the eyes or skin Side effects that usually do not require medical attention (report to your doctor or health care professional if they continue or are bothersome):  diarrhea  fluid retention and swelling  nausea  pain, redness, or irritation at site where injected This list may not describe all possible side effects. Call your doctor for medical advice about side effects. You may report side effects to FDA at 1-800-FDA-1088. Where should I keep my medicine? This drug is given in a hospital or clinic and will not be stored at home. NOTE: This sheet is a   summary. It may not cover all possible information. If you have questions about this medicine, talk to your doctor, pharmacist, or health care provider.  2020 Elsevier/Gold Standard (2016-10-07 11:14:47)  First Trimester of Pregnancy  The first trimester of pregnancy is from week 1 until  the end of week 13 (months 1 through 3). During this time, your baby will begin to develop inside you. At 6-8 weeks, the eyes and face are formed, and the heartbeat can be seen on ultrasound. At the end of 12 weeks, all the baby's organs are formed. Prenatal care is all the medical care you receive before the birth of your baby. Make sure you get good prenatal care and follow all of your doctor's instructions. Follow these instructions at home: Medicines  Take over-the-counter and prescription medicines only as told by your doctor. Some medicines are safe and some medicines are not safe during pregnancy.  Take a prenatal vitamin that contains at least 600 micrograms (mcg) of folic acid.  If you have trouble pooping (constipation), take medicine that will make your stool soft (stool softener) if your doctor approves. Eating and drinking   Eat regular, healthy meals.  Your doctor will tell you the amount of weight gain that is right for you.  Avoid raw meat and uncooked cheese.  If you feel sick to your stomach (nauseous) or throw up (vomit): ? Eat 4 or 5 small meals a day instead of 3 large meals. ? Try eating a few soda crackers. ? Drink liquids between meals instead of during meals.  To prevent constipation: ? Eat foods that are high in fiber, like fresh fruits and vegetables, whole grains, and beans. ? Drink enough fluids to keep your pee (urine) clear or pale yellow. Activity  Exercise only as told by your doctor. Stop exercising if you have cramps or pain in your lower belly (abdomen) or low back.  Do not exercise if it is too hot, too humid, or if you are in a place of great height (high altitude).  Try to avoid standing for long periods of time. Move your legs often if you must stand in one place for a long time.  Avoid heavy lifting.  Wear low-heeled shoes. Sit and stand up straight.  You can have sex unless your doctor tells you not to. Relieving pain and  discomfort  Wear a good support bra if your breasts are sore.  Take warm water baths (sitz baths) to soothe pain or discomfort caused by hemorrhoids. Use hemorrhoid cream if your doctor says it is okay.  Rest with your legs raised if you have leg cramps or low back pain.  If you have puffy, bulging veins (varicose veins) in your legs: ? Wear support hose or compression stockings as told by your doctor. ? Raise (elevate) your feet for 15 minutes, 3-4 times a day. ? Limit salt in your food. Prenatal care  Schedule your prenatal visits by the twelfth week of pregnancy.  Write down your questions. Take them to your prenatal visits.  Keep all your prenatal visits as told by your doctor. This is important. Safety  Wear your seat belt at all times when driving.  Make a list of emergency phone numbers. The list should include numbers for family, friends, the hospital, and police and fire departments. General instructions  Ask your doctor for a referral to a local prenatal class. Begin classes no later than at the start of month 6 of your pregnancy.  Ask for help  if you need counseling or if you need help with nutrition. Your doctor can give you advice or tell you where to go for help.  Do not use hot tubs, steam rooms, or saunas.  Do not douche or use tampons or scented sanitary pads.  Do not cross your legs for long periods of time.  Avoid all herbs and alcohol. Avoid drugs that are not approved by your doctor.  Do not use any tobacco products, including cigarettes, chewing tobacco, and electronic cigarettes. If you need help quitting, ask your doctor. You may get counseling or other support to help you quit.  Avoid cat litter boxes and soil used by cats. These carry germs that can cause birth defects in the baby and can cause a loss of your baby (miscarriage) or stillbirth.  Visit your dentist. At home, brush your teeth with a soft toothbrush. Be gentle when you floss. Contact a  doctor if:  You are dizzy.  You have mild cramps or pressure in your lower belly.  You have a nagging pain in your belly area.  You continue to feel sick to your stomach, you throw up, or you have watery poop (diarrhea).  You have a bad smelling fluid coming from your vagina.  You have pain when you pee (urinate).  You have increased puffiness (swelling) in your face, hands, legs, or ankles. Get help right away if:  You have a fever.  You are leaking fluid from your vagina.  You have spotting or bleeding from your vagina.  You have very bad belly cramping or pain.  You gain or lose weight rapidly.  You throw up blood. It may look like coffee grounds.  You are around people who have [place] measles, fifth disease, or chickenpox.  You have a very bad headache.  You have shortness of breath.  You have any kind of trauma, such as from a fall or a car accident. Summary  The first trimester of pregnancy is from week 1 until the end of week 13 (months 1 through 3).  To take care of yourself and your unborn baby, you will need to eat healthy meals, take medicines only if your doctor tells you to do so, and do activities that are safe for you and your baby.  Keep all follow-up visits as told by your doctor. This is important as your doctor will have to ensure that your baby is healthy and growing well. This information is not intended to replace advice given to you by your health care provider. Make sure you discuss any questions you have with your health care provider. Document Revised: 05/15/2018 Document Reviewed: 01/31/2016 Elsevier Patient Education  [place] Medications in Pregnancy   Acne: Benzoyl Peroxide Salicylic Acid  Backache/Headache: Tylenol: 2 regular strength every 4 hours OR              2 Extra strength every 6 hours  Colds/Coughs/Allergies: Benadryl (alcohol free) 25 mg every 6 hours as needed Breath right strips Claritin Cepacol  throat lozenges Chloraseptic throat spray Cold-Eeze- up to three times per day Cough drops, alcohol free Flonase (by prescription only) Guaifenesin Mucinex Robitussin DM (plain only, alcohol free) Saline nasal spray/drops Sudafed (pseudoephedrine) & Actifed ** use only after [redacted] weeks gestation and if you do not have high blood pressure Tylenol Vicks Vaporub Zinc lozenges Zyrtec   Constipation: Colace Ducolax suppositories Fleet enema Glycerin suppositories Metamucil Milk of magnesia Miralax Senokot Smooth move tea  Diarrhea: Kaopectate Imodium  A-D  *NO pepto Bismol  Hemorrhoids: Anusol Anusol HC Preparation H Tucks  Indigestion: Tums Maalox Mylanta Zantac  Pepcid  Insomnia: Benadryl (alcohol free) 25mg  every 6 hours as needed Tylenol PM Unisom, no Gelcaps  Leg Cramps: Tums MagGel  Nausea/Vomiting:  Bonine Dramamine Emetrol Ginger extract Sea bands Meclizine  Nausea medication to take during pregnancy:  Unisom (doxylamine succinate 25 mg tablets) Take one tablet daily at bedtime. If symptoms are not adequately controlled, the dose can be increased to a maximum recommended dose of two tablets daily (1/2 tablet in the morning, 1/2 tablet mid-afternoon and one at bedtime). Vitamin B6 100mg  tablets. Take one tablet twice a day (up to 200 mg per day).  Skin Rashes: Aveeno products Benadryl cream or 25mg  every 6 hours as needed Calamine Lotion 1% cortisone cream  Yeast infection: Gyne-lotrimin 7 Monistat 7   **If taking multiple medications, please check labels to avoid duplicating the same active ingredients **take medication as directed on the label ** Do not exceed 4000 mg of tylenol in 24 hours **Do not take medications that contain aspirin or ibuprofen

## 2019-04-02 NOTE — Progress Notes (Signed)
Pt is here for initial OB visit. LMP 01/15/19. EDD 10/22/19. Pt has hx of preterm delivery at 16 weeks last year.

## 2019-04-04 LAB — OBSTETRIC PANEL, INCLUDING HIV
Antibody Screen: NEGATIVE
Basophils Absolute: 0 10*3/uL (ref 0.0–0.2)
Basos: 0 %
EOS (ABSOLUTE): 0.1 10*3/uL (ref 0.0–0.4)
Eos: 1 %
HIV Screen 4th Generation wRfx: NONREACTIVE
Hematocrit: 36.6 % (ref 34.0–46.6)
Hemoglobin: 12.6 g/dL (ref 11.1–15.9)
Hepatitis B Surface Ag: NEGATIVE
Immature Grans (Abs): 0 10*3/uL (ref 0.0–0.1)
Immature Granulocytes: 0 %
Lymphocytes Absolute: 2.3 10*3/uL (ref 0.7–3.1)
Lymphs: 24 %
MCH: 28.6 pg (ref 26.6–33.0)
MCHC: 34.4 g/dL (ref 31.5–35.7)
MCV: 83 fL (ref 79–97)
Monocytes Absolute: 0.9 10*3/uL (ref 0.1–0.9)
Monocytes: 10 %
Neutrophils Absolute: 6 10*3/uL (ref 1.4–7.0)
Neutrophils: 65 %
Platelets: 219 10*3/uL (ref 150–450)
RBC: 4.4 x10E6/uL (ref 3.77–5.28)
RDW: 13.4 % (ref 11.7–15.4)
RPR Ser Ql: NONREACTIVE
Rh Factor: POSITIVE
Rubella Antibodies, IGG: 2.32 index (ref >0.99)
WBC: 9.4 10*3/uL (ref 3.4–10.8)

## 2019-04-04 LAB — HEMOGLOBIN A1C
Est. average glucose Bld gHb Est-mCnc: 114 mg/dL
Hgb A1c MFr Bld: 5.6 % (ref 4.8–5.6)

## 2019-04-04 LAB — URINE CULTURE, OB REFLEX: Organism ID, Bacteria: NO GROWTH

## 2019-04-04 LAB — CULTURE, OB URINE

## 2019-04-06 LAB — CERVICOVAGINAL ANCILLARY ONLY
Bacterial Vaginitis (gardnerella): POSITIVE — AB
Candida Glabrata: NEGATIVE
Candida Vaginitis: NEGATIVE
Chlamydia: NEGATIVE
Comment: NEGATIVE
Comment: NEGATIVE
Comment: NEGATIVE
Comment: NEGATIVE
Comment: NEGATIVE
Comment: NORMAL
Neisseria Gonorrhea: NEGATIVE
Trichomonas: NEGATIVE

## 2019-04-07 ENCOUNTER — Telehealth: Payer: Self-pay

## 2019-04-07 MED ORDER — METRONIDAZOLE 500 MG PO TABS: 500.0000 mg | ORAL_TABLET | Freq: Two times a day (BID) | ORAL | 0 refills | Status: DC

## 2019-04-07 NOTE — Telephone Encounter (Signed)
2nd attempt to reach pt regarding results Pt not ava unable to leave a vm

## 2019-04-07 NOTE — Telephone Encounter (Signed)
-----   Message from Sharyon Cable, CNM sent at 04/07/2019  2:09 PM EST ----- Patient called- no answer and unable to leave VM. MyChart not set up by patient.   Please call patient and notify of bacterial vaginosis.Rx for treatment sent to pharmacy on file.   Thank you!  Steward Drone CNM

## 2019-04-07 NOTE — Addendum Note (Signed)
Addended by: Sharyon Cable on: 04/07/2019 02:09 PM   Modules accepted: Orders

## 2019-04-13 ENCOUNTER — Encounter: Payer: Self-pay | Admitting: Certified Nurse Midwife

## 2019-04-13 ENCOUNTER — Telehealth: Payer: Self-pay

## 2019-04-13 NOTE — Telephone Encounter (Signed)
S/w pt about panorama results, advised I would leave results at front desk for pick up.

## 2019-04-14 ENCOUNTER — Encounter: Payer: Self-pay | Admitting: Certified Nurse Midwife

## 2019-04-20 ENCOUNTER — Encounter: Payer: Self-pay | Admitting: Certified Nurse Midwife

## 2019-04-28 ENCOUNTER — Ambulatory Visit (INDEPENDENT_AMBULATORY_CARE_PROVIDER_SITE_OTHER): Payer: Medicaid Other | Admitting: Certified Nurse Midwife

## 2019-04-28 ENCOUNTER — Other Ambulatory Visit (HOSPITAL_COMMUNITY)
Admission: RE | Admit: 2019-04-28 | Discharge: 2019-04-28 | Disposition: A | Discharge status: Home / Self Care | Payer: Medicaid Other | Source: Ambulatory Visit | Attending: Certified Nurse Midwife | Admitting: Certified Nurse Midwife

## 2019-04-28 ENCOUNTER — Other Ambulatory Visit: Payer: Self-pay

## 2019-04-28 ENCOUNTER — Encounter: Payer: Self-pay | Admitting: Obstetrics

## 2019-04-28 ENCOUNTER — Encounter: Payer: Self-pay | Admitting: Certified Nurse Midwife

## 2019-04-28 VITALS — BP 125/78 | HR 80 | Wt 213.0 lb

## 2019-04-28 DIAGNOSIS — B373 Candidiasis of vulva and vagina: Secondary | ICD-10-CM

## 2019-04-28 DIAGNOSIS — N898 Other specified noninflammatory disorders of vagina: Secondary | ICD-10-CM

## 2019-04-28 DIAGNOSIS — Z3A14 14 weeks gestation of pregnancy: Secondary | ICD-10-CM

## 2019-04-28 DIAGNOSIS — O26892 Other specified pregnancy related conditions, second trimester: Secondary | ICD-10-CM

## 2019-04-28 DIAGNOSIS — O99212 Obesity complicating pregnancy, second trimester: Secondary | ICD-10-CM

## 2019-04-28 DIAGNOSIS — O99210 Obesity complicating pregnancy, unspecified trimester: Secondary | ICD-10-CM

## 2019-04-28 DIAGNOSIS — O09892 Supervision of other high risk pregnancies, second trimester: Secondary | ICD-10-CM

## 2019-04-28 DIAGNOSIS — Z8669 Personal history of other diseases of the nervous system and sense organs: Secondary | ICD-10-CM

## 2019-04-28 DIAGNOSIS — O09899 Supervision of other high risk pregnancies, unspecified trimester: Secondary | ICD-10-CM | POA: Diagnosis not present

## 2019-04-28 MED ORDER — TERCONAZOLE 0.4 % VA CREA: 1.0000 | TOPICAL_CREAM | Freq: Every day | VAGINAL | 0 refills | Status: DC

## 2019-04-28 NOTE — Progress Notes (Signed)
PRENATAL VISIT NOTE  Subjective:  Kathy Bell is a 19 y.o. G2P0000 at [redacted]w[redacted]d being seen today for ongoing prenatal care.  She is currently monitored for the following issues for this high-risk pregnancy and has Hx of seizure disorder; Abnormal uterine bleeding; Contraceptive management; Family history of breast cancer; Supervision of other high risk pregnancy, antepartum; Hx of preterm delivery, currently pregnant; and Obesity during pregnancy, antepartum on their problem list.  Patient reports vaginal discharge.  Contractions: Not present. Vag. Bleeding: None.   . Denies leaking of fluid.   The following portions of the patient's history were reviewed and updated as appropriate: allergies, current medications, past family history, past medical history, past social history, past surgical history and problem list.   Objective:   Vitals:   04/28/19 1543  BP: 125/78  Pulse: 80  Weight: 213 lb (96.6 kg)    Fetal Status: Fetal Heart Rate (bpm): 160         General:  Alert, oriented and cooperative. Patient is in no acute distress.  Skin: Skin is warm and dry. No rash noted.   Cardiovascular: Normal heart rate noted  Respiratory: Normal respiratory effort, no problems with respiration noted  Abdomen: Soft, gravid, appropriate for gestational age.  Pain/Pressure: Absent     Pelvic: Cervical exam deferred      pelvic examination with sterile speculum performed- details below   Extremities: Normal range of motion.  Edema: None  Mental Status: Normal mood and affect. Normal behavior. Normal judgment and thought content.   Assessment and Plan:  Pregnancy: G2P0000 at [redacted]w[redacted]d 1. Supervision of other high risk pregnancy, antepartum - Patient presents to office today d/t vaginal discharge and irritation - Patient was scheduled for ROB next week, which was moved up to today  - Anticipatory guidance on upcoming appointments with 17P starting next week at 16 weeks - preterm labor precautions  discussed, warning signs and reasons to present to MAU - [place] MFM OB DETAIL +14 WK; Future - Culture, OB Urine  2. Hx of preterm delivery, currently pregnant - 17P to be started at 16 weeks and transvaginal US for CL scheduled for 4/1 - [place] MFM OB DETAIL +14 WK; Future  3. Obesity during pregnancy, antepartum - continue bASA - [place] MFM OB DETAIL +14 WK; Future  4. Vaginal discharge during pregnancy in second trimester - Patient reports completion of BV medication, once completed she noticed thick mucous discharge that started the next day and irritation - has been occurring for the past week and wanted to make sure her amniotic sac did not rupture again  - Pelvic exam performed with sterile speculum: Cervix pink, visually closed, without lesion, small amount of white curdy mucous discharge from cervix, vaginal walls and external genitalia normal, negative pooling  - cervical examination deferred   - Cervicovaginal ancillary only( [place]) - terconazole (TERAZOL 7) 0.4 % vaginal cream; Place 1 applicator vaginally at bedtime. Use for seven days  Dispense: 45 g; Refill: 0  Preterm labor symptoms and general obstetric precautions including but not limited to vaginal bleeding, contractions, leaking of fluid and fetal movement were reviewed in detail with the patient. Please refer to After Visit Summary for other counseling recommendations.   Return in about 4 weeks (around 05/26/2019) for HROB/17P.  Future Appointments  Date Time Provider [place]  05/07/2019  2:45 PM [place] None  05/07/2019  3:45 PM [place] 5 WH-MFCUS MFC-US  05/26/2019 10:55 AM [place], Rona Ravens, NP [facility] None  05/29/2019  1:30 PM [facility]-MFC [place] 1 WH-MFCUS MFC-US    Sharyon Cable, CNM

## 2019-04-28 NOTE — Progress Notes (Signed)
Pt presents for vaginal irritation x 1 weeks ago.  No sx's now but requests to be checked

## 2019-04-29 LAB — CERVICOVAGINAL ANCILLARY ONLY
Bacterial Vaginitis (gardnerella): NEGATIVE
Candida Glabrata: NEGATIVE
Candida Vaginitis: POSITIVE — AB
Comment: NEGATIVE
Comment: NEGATIVE
Comment: NEGATIVE

## 2019-04-30 LAB — CULTURE, OB URINE

## 2019-04-30 LAB — URINE CULTURE, OB REFLEX

## 2019-05-07 ENCOUNTER — Ambulatory Visit (INDEPENDENT_AMBULATORY_CARE_PROVIDER_SITE_OTHER): Payer: Medicaid Other

## 2019-05-07 ENCOUNTER — Ambulatory Visit (HOSPITAL_COMMUNITY)
Admission: RE | Admit: 2019-05-07 | Discharge: 2019-05-07 | Disposition: A | Discharge status: Home / Self Care | Payer: Medicaid Other | Source: Ambulatory Visit | Attending: Obstetrics and Gynecology | Admitting: Obstetrics and Gynecology

## 2019-05-07 ENCOUNTER — Other Ambulatory Visit: Payer: Self-pay

## 2019-05-07 VITALS — BP 132/73 | HR 86 | Ht 63.0 in | Wt 215.0 lb

## 2019-05-07 DIAGNOSIS — O09899 Supervision of other high risk pregnancies, unspecified trimester: Secondary | ICD-10-CM

## 2019-05-07 DIAGNOSIS — O09219 Supervision of pregnancy with history of pre-term labor, unspecified trimester: Secondary | ICD-10-CM | POA: Diagnosis not present

## 2019-05-07 DIAGNOSIS — Z3A16 16 weeks gestation of pregnancy: Secondary | ICD-10-CM

## 2019-05-07 DIAGNOSIS — O09299 Supervision of pregnancy with other poor reproductive or obstetric history, unspecified trimester: Secondary | ICD-10-CM

## 2019-05-07 DIAGNOSIS — Z3686 Encounter for antenatal screening for cervical length: Secondary | ICD-10-CM | POA: Diagnosis not present

## 2019-05-07 MED ORDER — HYDROXYPROGESTERONE CAPROATE 275 MG/1.1ML ~~LOC~~ SOAJ
275.0000 mg | Freq: Once | SUBCUTANEOUS | Status: AC
  Administered 2019-05-07: 15:00:00 275 mg via SUBCUTANEOUS

## 2019-05-07 NOTE — Progress Notes (Signed)
Presents for 17P Injection, given in RA, tolerated well.  NEXT 17P Injection in 1 week.  Patient was given 3  275 mg subcu Auto-Injectors to administer shots at home.  She was advised to save RX information and call in Refills before she uses the last Injector and send [place] a MyChart message with date and time whenever she administers a shot.   Administrations This Visit    HYDROXYprogesterone caproate (Makena) autoinjector 275 mg    Admin Date 05/07/2019 Action Given Dose 275 mg Route Subcutaneous Administered By Maretta Bees, RMA

## 2019-05-11 ENCOUNTER — Other Ambulatory Visit (HOSPITAL_COMMUNITY): Payer: Self-pay | Admitting: *Deleted

## 2019-05-11 DIAGNOSIS — O09292 Supervision of pregnancy with other poor reproductive or obstetric history, second trimester: Secondary | ICD-10-CM

## 2019-05-14 ENCOUNTER — Encounter: Payer: Self-pay | Admitting: Certified Nurse Midwife

## 2019-05-20 ENCOUNTER — Ambulatory Visit: Payer: Medicaid Other

## 2019-05-21 ENCOUNTER — Encounter: Payer: Self-pay | Admitting: Certified Nurse Midwife

## 2019-05-21 ENCOUNTER — Telehealth: Payer: Self-pay

## 2019-05-21 NOTE — Telephone Encounter (Signed)
Received message from pt - 17p self administered today w/o difficulty. Pt also called pharmacy to request refill.

## 2019-05-26 ENCOUNTER — Ambulatory Visit (INDEPENDENT_AMBULATORY_CARE_PROVIDER_SITE_OTHER): Payer: Medicaid Other | Admitting: Nurse Practitioner

## 2019-05-26 ENCOUNTER — Other Ambulatory Visit: Payer: Self-pay

## 2019-05-26 ENCOUNTER — Encounter: Payer: Self-pay | Admitting: Nurse Practitioner

## 2019-05-26 VITALS — BP 128/74 | HR 99 | Wt 219.0 lb

## 2019-05-26 DIAGNOSIS — O09892 Supervision of other high risk pregnancies, second trimester: Secondary | ICD-10-CM

## 2019-05-26 DIAGNOSIS — Z3A18 18 weeks gestation of pregnancy: Secondary | ICD-10-CM

## 2019-05-26 DIAGNOSIS — O09899 Supervision of other high risk pregnancies, unspecified trimester: Secondary | ICD-10-CM

## 2019-05-26 DIAGNOSIS — Z8669 Personal history of other diseases of the nervous system and sense organs: Secondary | ICD-10-CM

## 2019-05-26 DIAGNOSIS — E669 Obesity, unspecified: Secondary | ICD-10-CM

## 2019-05-26 DIAGNOSIS — O99212 Obesity complicating pregnancy, second trimester: Secondary | ICD-10-CM

## 2019-05-26 NOTE — Progress Notes (Signed)
    Subjective:  Kathy Bell is a 19 y.o. G2P0000 at [redacted]w[redacted]d being seen today for ongoing prenatal care.  She is currently monitored for the following issues for this high-risk pregnancy and has Hx of seizure disorder; Family history of breast cancer; Supervision of other high risk pregnancy, antepartum; Hx of preterm delivery, currently pregnant; and Obesity during pregnancy, antepartum on their problem list.  Patient reports round ligament pain from time to time but no cramping and no bleeding or leaking..  Contractions: Not present.  .   . Denies leaking of fluid.   The following portions of the patient's history were reviewed and updated as appropriate: allergies, current medications, past family history, past medical history, past social history, past surgical history and problem list. Problem list updated.  Objective:   Vitals:   05/26/19 1109  BP: 128/74  Pulse: 99  Weight: 219 lb (99.3 kg)    Fetal Status: Fetal Heart Rate (bpm): 154         General:  Alert, oriented and cooperative. Patient is in no acute distress.  Skin: Skin is warm and dry. No rash noted.   Cardiovascular: Normal heart rate noted  Respiratory: Normal respiratory effort, no problems with respiration noted  Abdomen: Soft, gravid, appropriate for gestational age. Pain/Pressure: Present     Pelvic:  Cervical exam deferred        Extremities: Normal range of motion.  Edema: None  Mental Status: Normal mood and affect. Normal behavior. Normal judgment and thought content.   Urinalysis:      Assessment and Plan:  Pregnancy: G2P0000 at [redacted]w[redacted]d  1. Supervision of other high risk pregnancy, antepartum Doing well Will complete getting babyscripts on her phone and will record BP weekly in Babyscripts  - AFP, Serum, Open Spina Bifida  2. Hx of preterm delivery, currently pregnant Taking 17P No cramping  Preterm labor symptoms and general obstetric precautions including but not limited to vaginal bleeding,  contractions, leaking of fluid and fetal movement were reviewed in detail with the patient. Please refer to After Visit Summary for other counseling recommendations.  Return in about 4 weeks (around 06/23/2019) for Virtual ROB.  Nolene Bernheim, RN, MSN, NP-BC Nurse Practitioner, [facility] for [employer], [facility] 05/26/2019 11:52 AM

## 2019-05-26 NOTE — Progress Notes (Signed)
ROB/AFP.  Patient is doing 17P Injections at home each Thursday.   Reports no problems today.

## 2019-05-28 ENCOUNTER — Telehealth: Payer: Self-pay

## 2019-05-28 LAB — AFP, SERUM, OPEN SPINA BIFIDA
AFP MoM: 0.52
AFP Value: 21.7 ng/mL
Gest. Age on Collection Date: 18.5 weeks
Maternal Age At EDD: 21.2 yr
OSBR Risk 1 IN: 10000
Test Results:: NEGATIVE
Weight: 219 [lb_av]

## 2019-05-28 NOTE — Telephone Encounter (Signed)
Pt sent My Chart Message that she did her 17-P Injection at home.  Message sent back to pt to be sure to call pharmacy for refills.

## 2019-05-29 ENCOUNTER — Ambulatory Visit (HOSPITAL_COMMUNITY)
Admission: RE | Admit: 2019-05-29 | Discharge: 2019-05-29 | Disposition: A | Discharge status: Home / Self Care | Payer: Medicaid Other | Source: Ambulatory Visit | Attending: Obstetrics and Gynecology | Admitting: Obstetrics and Gynecology

## 2019-05-29 ENCOUNTER — Other Ambulatory Visit: Payer: Self-pay

## 2019-05-29 ENCOUNTER — Encounter (HOSPITAL_COMMUNITY): Payer: Self-pay

## 2019-05-29 ENCOUNTER — Other Ambulatory Visit (HOSPITAL_COMMUNITY): Payer: Self-pay | Admitting: *Deleted

## 2019-05-29 ENCOUNTER — Ambulatory Visit (HOSPITAL_COMMUNITY): Payer: Medicaid Other | Admitting: *Deleted

## 2019-05-29 DIAGNOSIS — O09292 Supervision of pregnancy with other poor reproductive or obstetric history, second trimester: Secondary | ICD-10-CM

## 2019-05-29 DIAGNOSIS — O99210 Obesity complicating pregnancy, unspecified trimester: Secondary | ICD-10-CM | POA: Diagnosis not present

## 2019-05-29 DIAGNOSIS — O09293 Supervision of pregnancy with other poor reproductive or obstetric history, third trimester: Secondary | ICD-10-CM

## 2019-05-29 DIAGNOSIS — O09899 Supervision of other high risk pregnancies, unspecified trimester: Secondary | ICD-10-CM

## 2019-05-29 DIAGNOSIS — Z3A19 19 weeks gestation of pregnancy: Secondary | ICD-10-CM | POA: Diagnosis not present

## 2019-05-29 DIAGNOSIS — Z363 Encounter for antenatal screening for malformations: Secondary | ICD-10-CM | POA: Diagnosis not present

## 2019-05-29 DIAGNOSIS — Z3686 Encounter for antenatal screening for cervical length: Secondary | ICD-10-CM | POA: Diagnosis not present

## 2019-05-29 DIAGNOSIS — O09213 Supervision of pregnancy with history of pre-term labor, third trimester: Secondary | ICD-10-CM | POA: Diagnosis not present

## 2019-05-29 DIAGNOSIS — Z362 Encounter for other antenatal screening follow-up: Secondary | ICD-10-CM

## 2019-06-02 DIAGNOSIS — Q100 Congenital ptosis: Secondary | ICD-10-CM | POA: Diagnosis not present

## 2019-06-02 DIAGNOSIS — H5213 Myopia, bilateral: Secondary | ICD-10-CM | POA: Diagnosis not present

## 2019-06-02 DIAGNOSIS — H538 Other visual disturbances: Secondary | ICD-10-CM | POA: Diagnosis not present

## 2019-06-05 ENCOUNTER — Inpatient Hospital Stay (HOSPITAL_COMMUNITY): Payer: Medicaid Other | Admitting: Anesthesiology

## 2019-06-05 ENCOUNTER — Inpatient Hospital Stay (HOSPITAL_COMMUNITY)
Admission: AD | Admit: 2019-06-05 | Discharge: 2019-06-06 | DRG: 805 | Disposition: A | Discharge status: Home / Self Care | Payer: Medicaid Other | Attending: Obstetrics and Gynecology | Admitting: Obstetrics and Gynecology

## 2019-06-05 ENCOUNTER — Other Ambulatory Visit: Payer: Self-pay

## 2019-06-05 ENCOUNTER — Inpatient Hospital Stay (HOSPITAL_BASED_OUTPATIENT_CLINIC_OR_DEPARTMENT_OTHER): Payer: Medicaid Other

## 2019-06-05 ENCOUNTER — Encounter (HOSPITAL_COMMUNITY): Payer: Self-pay | Admitting: Obstetrics and Gynecology

## 2019-06-05 DIAGNOSIS — O42912 Preterm premature rupture of membranes, unspecified as to length of time between rupture and onset of labor, second trimester: Secondary | ICD-10-CM | POA: Diagnosis not present

## 2019-06-05 DIAGNOSIS — O364XX0 Maternal care for intrauterine death, not applicable or unspecified: Secondary | ICD-10-CM | POA: Diagnosis present

## 2019-06-05 DIAGNOSIS — Z3A00 Weeks of gestation of pregnancy not specified: Secondary | ICD-10-CM | POA: Diagnosis not present

## 2019-06-05 DIAGNOSIS — E669 Obesity, unspecified: Secondary | ICD-10-CM | POA: Diagnosis present

## 2019-06-05 DIAGNOSIS — Z3A20 20 weeks gestation of pregnancy: Secondary | ICD-10-CM

## 2019-06-05 DIAGNOSIS — O321XX0 Maternal care for breech presentation, not applicable or unspecified: Secondary | ICD-10-CM | POA: Diagnosis present

## 2019-06-05 DIAGNOSIS — Z20822 Contact with and (suspected) exposure to covid-19: Secondary | ICD-10-CM | POA: Diagnosis present

## 2019-06-05 DIAGNOSIS — Z3686 Encounter for antenatal screening for cervical length: Secondary | ICD-10-CM | POA: Diagnosis not present

## 2019-06-05 DIAGNOSIS — Z8751 Personal history of pre-term labor: Secondary | ICD-10-CM

## 2019-06-05 DIAGNOSIS — O99214 Obesity complicating childbirth: Secondary | ICD-10-CM | POA: Diagnosis present

## 2019-06-05 DIAGNOSIS — O3432 Maternal care for cervical incompetence, second trimester: Secondary | ICD-10-CM | POA: Diagnosis present

## 2019-06-05 DIAGNOSIS — O09899 Supervision of other high risk pregnancies, unspecified trimester: Secondary | ICD-10-CM

## 2019-06-05 LAB — GC/CHLAMYDIA PROBE AMP (~~LOC~~) NOT AT ARMC
Chlamydia: NEGATIVE
Comment: NEGATIVE
Comment: NORMAL
Neisseria Gonorrhea: NEGATIVE

## 2019-06-05 LAB — CBC
HCT: 36.1 % (ref 36.0–46.0)
Hemoglobin: 12.2 g/dL (ref 12.0–15.0)
MCH: 29 pg (ref 26.0–34.0)
MCHC: 33.8 g/dL (ref 30.0–36.0)
MCV: 85.7 fL (ref 80.0–100.0)
Platelets: 165 10*3/uL (ref 150–400)
RBC: 4.21 MIL/uL (ref 3.87–5.11)
RDW: 12.4 % (ref 11.5–15.5)
WBC: 13.4 10*3/uL — ABNORMAL HIGH (ref 4.0–10.5)
nRBC: 0 % (ref 0.0–0.2)

## 2019-06-05 LAB — RESPIRATORY PANEL BY RT PCR (FLU A&B, COVID)
Influenza A by PCR: NEGATIVE
Influenza B by PCR: NEGATIVE
SARS Coronavirus 2 by RT PCR: NEGATIVE

## 2019-06-05 LAB — TYPE AND SCREEN
ABO/RH(D): B POS
Antibody Screen: NEGATIVE

## 2019-06-05 LAB — WET PREP, GENITAL
Clue Cells Wet Prep HPF POC: NONE SEEN
Sperm: NONE SEEN
Trich, Wet Prep: NONE SEEN
Yeast Wet Prep HPF POC: NONE SEEN

## 2019-06-05 LAB — RPR: RPR Ser Ql: NONREACTIVE

## 2019-06-05 MED ORDER — OXYCODONE-ACETAMINOPHEN 5-325 MG PO TABS: 2.0000 | ORAL_TABLET | ORAL | Status: DC | PRN

## 2019-06-05 MED ORDER — ONDANSETRON HCL 4 MG PO TABS: 4.0000 mg | ORAL_TABLET | ORAL | Status: DC | PRN

## 2019-06-05 MED ORDER — ACETAMINOPHEN 325 MG PO TABS: 650.0000 mg | ORAL_TABLET | ORAL | Status: DC | PRN

## 2019-06-05 MED ORDER — LACTATED RINGERS IV SOLN: INTRAVENOUS | Status: DC

## 2019-06-05 MED ORDER — OXYTOCIN 40 UNITS IN NORMAL SALINE INFUSION - SIMPLE MED
2.5000 [IU]/h | INTRAVENOUS | Status: DC
  Filled 2019-06-05: qty 1000

## 2019-06-05 MED ORDER — FENTANYL-BUPIVACAINE-NACL 0.5-0.125-0.9 MG/250ML-% EP SOLN
12.0000 mL/h | EPIDURAL | Status: DC | PRN
  Filled 2019-06-05: qty 250

## 2019-06-05 MED ORDER — OXYTOCIN BOLUS FROM INFUSION
500.0000 mL | Freq: Once | INTRAVENOUS | Status: AC
  Administered 2019-06-05: 500 mL via INTRAVENOUS

## 2019-06-05 MED ORDER — OXYTOCIN 10 UNIT/ML IJ SOLN
INTRAMUSCULAR | Status: AC
  Filled 2019-06-05: qty 1

## 2019-06-05 MED ORDER — PHENYLEPHRINE 40 MCG/ML (10ML) SYRINGE FOR IV PUSH (FOR BLOOD PRESSURE SUPPORT)
80.0000 ug | PREFILLED_SYRINGE | INTRAVENOUS | Status: DC | PRN
  Filled 2019-06-05: qty 10

## 2019-06-05 MED ORDER — ONDANSETRON HCL 4 MG/2ML IJ SOLN
4.0000 mg | Freq: Four times a day (QID) | INTRAMUSCULAR | Status: DC | PRN
  Administered 2019-06-05: 4 mg via INTRAVENOUS
  Filled 2019-06-05: qty 2

## 2019-06-05 MED ORDER — PRENATAL MULTIVITAMIN CH: 1.0000 | ORAL_TABLET | Freq: Every day | ORAL | Status: DC

## 2019-06-05 MED ORDER — DOCUSATE SODIUM 100 MG PO CAPS: 100.0000 mg | ORAL_CAPSULE | Freq: Every day | ORAL | Status: DC

## 2019-06-05 MED ORDER — EPHEDRINE 5 MG/ML INJ: 10.0000 mg | INTRAVENOUS | Status: DC | PRN

## 2019-06-05 MED ORDER — IBUPROFEN 600 MG PO TABS
600.0000 mg | ORAL_TABLET | Freq: Four times a day (QID) | ORAL | Status: DC
  Administered 2019-06-05 – 2019-06-06 (×3): 600 mg via ORAL
  Filled 2019-06-05 (×3): qty 1

## 2019-06-05 MED ORDER — WITCH HAZEL-GLYCERIN EX PADS: 1.0000 "application " | MEDICATED_PAD | CUTANEOUS | Status: DC | PRN

## 2019-06-05 MED ORDER — FLEET ENEMA 7-19 GM/118ML RE ENEM: 1.0000 | ENEMA | RECTAL | Status: DC | PRN

## 2019-06-05 MED ORDER — OXYCODONE-ACETAMINOPHEN 5-325 MG PO TABS: 1.0000 | ORAL_TABLET | ORAL | Status: DC | PRN

## 2019-06-05 MED ORDER — OXYTOCIN 40 UNITS IN NORMAL SALINE INFUSION - SIMPLE MED
1.0000 m[IU]/min | INTRAVENOUS | Status: DC
  Administered 2019-06-05: 2 m[IU]/min via INTRAVENOUS

## 2019-06-05 MED ORDER — LIDOCAINE HCL (PF) 1 % IJ SOLN
INTRAMUSCULAR | Status: DC | PRN
  Administered 2019-06-05: 5 mL via EPIDURAL

## 2019-06-05 MED ORDER — LIDOCAINE HCL (PF) 1 % IJ SOLN: 30.0000 mL | INTRAMUSCULAR | Status: DC | PRN

## 2019-06-05 MED ORDER — BENZOCAINE-MENTHOL 20-0.5 % EX AERO
1.0000 "application " | INHALATION_SPRAY | CUTANEOUS | Status: DC | PRN
  Administered 2019-06-05: 1 via TOPICAL
  Filled 2019-06-05: qty 56

## 2019-06-05 MED ORDER — ONDANSETRON HCL 4 MG/2ML IJ SOLN: 4.0000 mg | INTRAMUSCULAR | Status: DC | PRN

## 2019-06-05 MED ORDER — OXYCODONE HCL 5 MG PO TABS: 10.0000 mg | ORAL_TABLET | ORAL | Status: DC | PRN

## 2019-06-05 MED ORDER — PROMETHAZINE HCL 25 MG/ML IJ SOLN
12.5000 mg | Freq: Once | INTRAMUSCULAR | Status: AC
  Administered 2019-06-05: 12.5 mg via INTRAVENOUS
  Filled 2019-06-05: qty 1

## 2019-06-05 MED ORDER — COCONUT OIL OIL: 1.0000 "application " | TOPICAL_OIL | Status: DC | PRN

## 2019-06-05 MED ORDER — SENNOSIDES-DOCUSATE SODIUM 8.6-50 MG PO TABS
2.0000 | ORAL_TABLET | ORAL | Status: DC
  Administered 2019-06-05: 2 via ORAL
  Filled 2019-06-05: qty 2

## 2019-06-05 MED ORDER — SOD CITRATE-CITRIC ACID 500-334 MG/5ML PO SOLN: 30.0000 mL | ORAL | Status: DC | PRN

## 2019-06-05 MED ORDER — ZOLPIDEM TARTRATE 5 MG PO TABS: 5.0000 mg | ORAL_TABLET | Freq: Every evening | ORAL | Status: DC | PRN

## 2019-06-05 MED ORDER — DIBUCAINE (PERIANAL) 1 % EX OINT: 1.0000 "application " | TOPICAL_OINTMENT | CUTANEOUS | Status: DC | PRN

## 2019-06-05 MED ORDER — LACTATED RINGERS IV SOLN: 500.0000 mL | Freq: Once | INTRAVENOUS | Status: DC

## 2019-06-05 MED ORDER — OXYCODONE HCL 5 MG PO TABS
5.0000 mg | ORAL_TABLET | ORAL | Status: DC | PRN
  Administered 2019-06-06: 5 mg via ORAL
  Filled 2019-06-05: qty 1

## 2019-06-05 MED ORDER — LACTATED RINGERS IV SOLN: 500.0000 mL | INTRAVENOUS | Status: DC | PRN

## 2019-06-05 MED ORDER — SIMETHICONE 80 MG PO CHEW: 80.0000 mg | CHEWABLE_TABLET | ORAL | Status: DC | PRN

## 2019-06-05 MED ORDER — FENTANYL CITRATE (PF) 100 MCG/2ML IJ SOLN
100.0000 ug | INTRAMUSCULAR | Status: DC | PRN
  Administered 2019-06-05 (×3): 100 ug via INTRAVENOUS
  Filled 2019-06-05 (×4): qty 2

## 2019-06-05 MED ORDER — DIPHENHYDRAMINE HCL 50 MG/ML IJ SOLN: 12.5000 mg | INTRAMUSCULAR | Status: DC | PRN

## 2019-06-05 MED ORDER — CALCIUM CARBONATE ANTACID 500 MG PO CHEW: 2.0000 | CHEWABLE_TABLET | ORAL | Status: DC | PRN

## 2019-06-05 MED ORDER — PHENYLEPHRINE 40 MCG/ML (10ML) SYRINGE FOR IV PUSH (FOR BLOOD PRESSURE SUPPORT)
80.0000 ug | PREFILLED_SYRINGE | INTRAVENOUS | Status: DC | PRN
  Administered 2019-06-05 (×2): 80 ug via INTRAVENOUS

## 2019-06-05 MED ORDER — SODIUM CHLORIDE (PF) 0.9 % IJ SOLN
INTRAMUSCULAR | Status: DC | PRN
  Administered 2019-06-05: 12 mL/h via EPIDURAL

## 2019-06-05 MED ORDER — MISOPROSTOL 200 MCG PO TABS
ORAL_TABLET | ORAL | Status: AC
  Filled 2019-06-05: qty 2

## 2019-06-05 MED ORDER — MAGNESIUM HYDROXIDE 400 MG/5ML PO SUSP: 30.0000 mL | ORAL | Status: DC | PRN

## 2019-06-05 MED ORDER — MISOPROSTOL 200 MCG PO TABS
400.0000 ug | ORAL_TABLET | Freq: Once | ORAL | Status: AC
  Administered 2019-06-05: 400 ug via BUCCAL

## 2019-06-05 MED ORDER — DIPHENHYDRAMINE HCL 25 MG PO CAPS: 25.0000 mg | ORAL_CAPSULE | Freq: Four times a day (QID) | ORAL | Status: DC | PRN

## 2019-06-05 NOTE — Discharge Summary (Addendum)
Postpartum Discharge Summary  Date of Service updated5/1     Patient Name: Kathy Bell DOB: 2000/12/27 MRN: [id number]  Date of admission: 06/05/2019 Delivering Provider: Clarnce Flock   Date of discharge: 06/06/2019  Admitting diagnosis: Preterm labor in second trimester [O60.02] Intrauterine pregnancy: [redacted]w[redacted]d    Secondary diagnosis:  Active Problems:   Preterm labor in second trimester   Preterm delivery Cervical incompetency Additional problems:      Discharge diagnosis: Preterm Pregnancy Delivered                                                                                Possible   Cervical incompetency             Post partum procedures:  Augmentation: none  Complications: [facility]:  Onset of Labor With Vaginal Delivery     19y.o. yo G3P0010 at 21w1das admitted in Active Labor on 06/05/2019. She had anatomy scan on 4/23, that showed cervical length of 3.26 cm on TV u/s, and was on Makena. Patient had an uncomplicated labor course as follows: arrived at 6cm and managed expectantly.  She progressed slowly. See labor management notes After reaching 10cm dilation and thorough discussion of pre-viability patient pushed and had breech NSVD complicated by fetal head entrapment that resolved after approximately 15 minutes. Supportive care only administered. Membrane Rupture Time/Date: 1:41 PM ,06/05/2019   Intrapartum Procedures: Episiotomy: None [1]                                         Lacerations:  None [1]  Patient had a delivery of a Non Viable infant. 06/05/2019  Information for the patient's newborn:  [place], [place]0[[id number]]Delivery Method: Vaginal, Spontaneous(Filed from Delivery Summary)     Pateint had an uncomplicated postpartum course.  She is ambulating, tolerating a regular diet, passing flatus, and urinating well. Patient is discharged home in stable condition on 06/06/19.  Delivery time: 1:59 PM    Magnesium  Sulfate received: No BMZ received: No Rhophylac:N/A MMR:N/A Transfusion:No  Physical exam  Vitals:   06/05/19 2315 06/05/19 2317 06/06/19 0348 06/06/19 0800  BP: (!) 128/47 (!) 122/47 (!) 124/49 130/71  Pulse: 90 91 92 93  Resp:  _0 Temp:  98.4 F (36.9 C) 98.1 F (36.7 C) 98.2 F (36.8 C)  TempSrc:  Oral Oral Oral  SpO2:  100% 100%   Weight:      Height:       General: alert, cooperative and no distress Lochia: appropriate Uterine Fundus: too small to feel Incision: N/A DVT Evaluation: No evidence of DVT seen on physical exam. Labs: Lab Results  Component Value Date   WBC 13.4 (H) 06/05/2019   HGB 12.2 06/05/2019   HCT 36.1 06/05/2019   MCV 85.7 06/05/2019   PLT 165 06/05/2019   CMP Latest Ref Rng & Units 09/15/2017  Glucose 70 - 99 mg/dL 108(H)  BUN 6 - 20 mg/dL 8  Creatinine 0.44 - 1.00 mg/dL 0.90  Sodium 135 - 145 mmol/L 136  Potassium 3.5 - 5.1 mmol/L 3.4(L)  Chloride 98 - 111 mmol/L 102  CO2 22 - 32 mmol/L 25  Calcium 8.9 - 10.3 mg/dL 8.9  Total Protein 6.5 - 8.1 g/dL -  Total Bilirubin 0.3 - 1.2 mg/dL -  Alkaline Phos 38 - 126 U/L -  AST 15 - 41 U/L -  ALT 14 - 54 U/L -   Edinburgh Score: Edinburgh Postnatal Depression Scale Screening Tool 04/21/2018  I have been able to laugh and see the funny side of things. 1  I have looked forward with enjoyment to things. 0  I have blamed myself unnecessarily when things went wrong. 2  I have been anxious or worried for no good reason. 0  I have felt scared or panicky for no good reason. 0  Things have been getting on top of me. 2  I have been so unhappy that I have had difficulty sleeping. 2  I have felt sad or miserable. 1  I have been so unhappy that I have been crying. 1  The thought of harming myself has occurred to me. 0  Edinburgh Postnatal Depression Scale Total 9    Discharge instruction: per After Visit Summary and .  After visit meds:  Vitamins, ibuprofen, oxycodone.  OTC  acetaminophen.  Diet: regular  Activity: Advance as tolerated. Pelvic rest for 6 weeks.                 Contraception undecided. Outpatient follow up:1 week Follow up Appt: Future Appointments  Date Time Provider [place]  06/23/2019  8:55 AM Virginia Rochester, NP [facility] None  06/26/2019  7:30 AM [facility] NURSE [facility]  06/26/2019  7:30 AM [facility] [place] 3 [facility]   Follow up Visit:   Please schedule this patient for Postpartum visit in: 1 week with the following provider: Any provider In-Person For C/S patients schedule nurse incision check in weeks 2 weeks: no High risk pregnancy complicated by: preterm delivery at 20 weeks, fetal demise Delivery mode:  SVD Anticipated Birth Control:  other/unsure PP Procedures needed: none  Schedule Integrated [place] visit: yes   Newborn Data: Deceased female immature. Birth Weight:  Under 500 gm APGAR: 0, 0  Newborn Delivery   Birth date/time: 06/05/2019 13:59:00 Delivery type: Vaginal, Spontaneous      Baby Feeding: n/a Disposition:morgue   06/06/2019 Debbrah Alar, MD Updated by Debbrah Alar, MD  Patient notes pain not controlled by ibuprofen and acetaminophen, has been taking oxycodone while inpatient for breakthrough pain.  Cautiously prescribed small number of narcotic pain medications for short term use.

## 2019-06-05 NOTE — MAU Note (Signed)
covid swab obtained without difficulty and pt tol well. No symptoms °

## 2019-06-05 NOTE — MAU Note (Signed)
Abd pain since 2100 and has gotten worse. Increase in vag d/c but denies LOF or VB

## 2019-06-05 NOTE — Consult Note (Signed)
Neonatology consult:  Asked by Dr March Rummage to counsel Kathy Bell who is in imminent delivery at [redacted] wks gestation. I reviewed her chart. She has lost a 16 wk pregnancy previously. She has been told that this baby is nonviable but she just wanted to know if there is anything that can be done for the baby.  I discussed limits of viability based on gestation, development and physiology. I confirmed that [redacted] wks gestation is non-viable but offered to be available at delivery to confirm gestation. Sadly, we can only recommend comfort care at this gestation.  Lucillie Garfinkel MD Neonatologist

## 2019-06-05 NOTE — Anesthesia Preprocedure Evaluation (Addendum)
Anesthesia Evaluation  Patient identified by MRN, date of birth, ID band Patient awake    Reviewed: Allergy & Precautions, NPO status , Patient's Chart, lab work & pertinent test results  Airway Mallampati: II  TM Distance: >3 FB Neck ROM: Full    Dental no notable dental hx. (+) Teeth Intact   Pulmonary neg pulmonary ROS,    Pulmonary exam normal breath sounds clear to auscultation       Cardiovascular negative cardio ROS Normal cardiovascular exam Rhythm:Regular Rate:Normal     Neuro/Psych negative neurological ROS  negative psych ROS   GI/Hepatic negative GI ROS, Neg liver ROS,   Endo/Other  negative endocrine ROS  Renal/GU negative Renal ROS     Musculoskeletal negative musculoskeletal ROS (+)   Abdominal (+) + obese,   Peds  Hematology Lab Results      Component                Value               Date                      WBC                      13.4 (H)            06/05/2019                HGB                      12.2                06/05/2019                HCT                      36.1                06/05/2019                MCV                      85.7                06/05/2019                PLT                      165                 06/05/2019             Anesthesia Other Findings   Reproductive/Obstetrics (+) Pregnancy                             Anesthesia Physical Anesthesia Plan  ASA: III  Anesthesia Plan: Epidural   Post-op Pain Management:    Induction:   PONV Risk Score and Plan:   Airway Management Planned:   Additional Equipment:   Intra-op Plan:   Post-operative Plan:   Informed Consent:   Plan Discussed with:   Anesthesia Plan Comments: (20.wk G2P0 for LEA)        Anesthesia Quick Evaluation

## 2019-06-05 NOTE — MAU Provider Note (Signed)
Chief Complaint:  Abdominal Pain   First Provider Initiated Contact with Patient 06/05/19 0148     HPI: Kathy Bell is a 19 y.o. G2P0010 at 20w1dho presents to maternity admissions reporting abdominal pain for the past 5 hours.  Has increased in intensity over that time. Has a history of a 16 week loss in February 2020 due to PPROM.  Has been on 17-P this pregnancy, last dose today. . She reports good fetal movement, denies LOF, vaginal bleeding, vaginal itching/burning, urinary symptoms, h/a, dizziness, n/v, diarrhea, constipation or fever/chills.    Abdominal Pain This is a new problem. The current episode started today. The problem occurs intermittently. The problem has been unchanged. The pain is located in the suprapubic region, LLQ and RLQ. The pain is severe. The quality of the pain is cramping. The abdominal pain radiates to the back. Associated symptoms include nausea. Pertinent negatives include no constipation, diarrhea, dysuria, fever, frequency, myalgias or vomiting. Nothing aggravates the pain. The pain is relieved by nothing. She has tried nothing for the symptoms.    RN Note: Abd pain since 2100 and has gotten worse. Increase in vag d/c but denies LOF or VB  Past Medical History: Past Medical History:  Diagnosis Date  . Anhydramnios 04/04/2018  . Chlamydia   . Encounter for supervision of other normal pregnancy, first trimester 01/30/2018    Nursing Staff Provider Office Location  [place] Dating   LMP: 10/29/17 Language   English Anatomy [place]  Flu Vaccine   11/13/17 Genetic Screen  NIPS:   AFP:   First Screen:  Quad:   TDaP vaccine    Hgb A1C or  GTT Early  Third trimester  Rhogam     LAB RESULTS  Feeding Plan  Breast Blood Type    Contraception  Undecided Antibody   Circumcision  Yes Rubella   Pediatrician   Undecided RPR Non Reactive   . Preterm premature rupture of membranes (PPROM) with unknown onset of labor 04/06/2018  . PROM (premature rupture of membranes) 04/04/2018  .  Seizure ([place]   . 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 Vaginal delivery 04/06/2018    Past obstetric history: OB History  Gravida Para Term Preterm AB Living  2 1 0 0 0 0  SAB TAB Ectopic Multiple Live Births  0 0 0 0 0    # Outcome Date GA Lbr Len/2nd Weight Sex Delivery Anes PTL Lv  2 Current           1 Para 04/05/18 [id number]  M Vag-Spont None  FD    Past Surgical History: Past Surgical History:  Procedure Laterality Date  . NO PAST SURGERIES      Family History: Family History  Problem Relation Age of Onset  . Cancer Mother 33 57    Passed away from breast cancer at 33 68ars old  . Cancer Maternal Grandmother        Cancer at young age, and then passed away from cancer at 50 34ars old  . Healthy Father   . Kidney disease Maternal Grandfather   . Kidney disease Paternal Aunt     Social History: Social History   Tobacco Use  . Smoking status: Never Smoker  . Smokeless tobacco: Never Used  Substance Use Topics  . Alcohol use: No  . Drug use: No    Allergies: No Known Allergies  Meds:  Medications Prior to Admission  Medication  Sig Dispense Refill Last Dose  . aspirin EC 81 MG tablet Take 1 tablet (81 mg total) by mouth daily. Take after 12 weeks for prevention of preeclampsia later in pregnancy 300 tablet 0 06/04/2019 at Unknown time  . OVER THE COUNTER MEDICATION IRON   06/04/2019 at Unknown time  . Prenatal MV-Min-FA-Omega-3 (PRENATAL GUMMIES/DHA & FA) 0.4-32.5 MG CHEW Chew 3 tablets by mouth daily. 90 tablet 11 06/04/2019 at Unknown time  . acetaminophen (TYLENOL) 325 MG tablet Take 2 tablets (650 mg total) by mouth every 4 (four) hours as needed (for pain scale < 4). (Patient not taking: Reported on 03/25/2019)     . Blood Pressure Monitoring (BLOOD PRESSURE KIT) DEVI 1 kit by Does not apply route as needed. 1 each 0   . HYDROXYprogesterone Caproate (MAKENA IM) Inject into the muscle.     . terconazole (TERAZOL 7) 0.4 % vaginal  cream Place 1 applicator vaginally at bedtime. Use for seven days (Patient not taking: Reported on 05/29/2019) 45 g 0     I have reviewed patient's Past Medical Hx, Surgical Hx, Family Hx, Social Hx, medications and allergies.   ROS:  Review of Systems  Constitutional: Negative for fever.  Gastrointestinal: Positive for abdominal pain and nausea. Negative for constipation, diarrhea and vomiting.  Genitourinary: Negative for dysuria and frequency.  Musculoskeletal: Negative for myalgias.   Other systems negative  Physical Exam   Patient Vitals for the past 24 hrs:  BP Temp Pulse Resp Height Weight  06/05/19 0128 (!) 122/52 (!) 97.4 F (36.3 C) 89 20 -- --  06/05/19 0127 -- -- -- -- 5' 3"  (1.6 m) 98.9 kg   Constitutional: Well-developed, well-nourished female in no acute distress but quite uncomfortable  Cardiovascular: normal rate and rhythm Respiratory: normal effort, clear to auscultation bilaterally GI: Abd soft, tender, gravid appropriate for gestational age.   No rebound or guarding. MS: Extremities nontender, no edema, normal ROM Neurologic: Alert and oriented x 4.  GU: Neg CVAT.  PELVIC EXAM: Cervix not visible, Bulging membranes in vagina at approximately 6cm, scant clear  discharge, vaginal walls and external genitalia normal Exam by:: m.Yina Riviere cnm  FHT:  140   Labs: Pending results B/Positive/-- (02/25 1422)  Imaging:  Pending results  MAU Course/MDM: I have ordered labs and bedside US.  US demonstrated about 7cm dilation of cervix. Consult Dr Vevelyn Francois with presentation, exam findings and test results.  Treatments in MAU included Fentanyl.    Assessment: 1. Preterm labor in second trimester without delivery   2. Hx of preterm delivery, currently pregnant     Plan: Admit to Labor and Delivery Expectant management Fentanyl for pain Report given to [facility] CNM    Hansel Feinstein CNM, MSN Certified Nurse-Midwife 06/05/2019 2:21 AM

## 2019-06-05 NOTE — H&P (Addendum)
OBSTETRIC ADMISSION HISTORY AND PHYSICAL  Kathy Bell is a 19 y.o. female G2P0000 with IUP at 76w1dby LMP presenting for abdominal pain radiating to back and admitted for preterm labor in 2nd trimester. She has history of prior 16-week loss due to PPROM and was being treated with weekly 17P injections. With the 16 week delivery, her placenta delivered and she did not need D&C. She reports +FMs, No LOF, no VB, no blurry vision, headaches or peripheral edema, and RUQ pain.  [facility]  Dating: By LMP --->  Estimated Date of Delivery: 10/22/19  Sono:    @[redacted]w[redacted]d , CWD, normal anatomy, cephalic presentation,  2275T 2% EFW   Prenatal History/Complications:  PMH of 16 week loss of pregnancy due to PPROM   Past Medical History: Past Medical History:  Diagnosis Date   Anhydramnios 04/04/2018   Chlamydia    Encounter for supervision of other normal pregnancy, first trimester 01/30/2018    Nursing Staff Provider Office Location  [facility] Dating   LMP: 10/29/17 Language   English Anatomy [place]  Flu Vaccine   11/13/17 Genetic Screen  NIPS:   AFP:   First Screen:  Quad:   TDaP vaccine    Hgb A1C or  GTT Early  Third trimester  Rhogam     LAB RESULTS  Feeding Plan  Breast Blood Type    Contraception  Undecided Antibody   Circumcision  Yes Rubella   Pediatrician   Undecided RPR Non Reactive    Preterm premature rupture of membranes (PPROM) with unknown onset of labor 04/06/2018   PROM (premature rupture of membranes) 04/04/2018   Seizure ([facility]    Seizures ([facility]    epileptic, no seizure or meds in ~357yr  Vaginal delivery 04/06/2018    Past Surgical History: Past Surgical History:  Procedure Laterality Date   NO PAST SURGERIES      Obstetrical History: OB History     Gravida  3   Para  1   Term  0   Preterm  0   AB  1   Living  0      SAB  1   TAB  0   Ectopic  0   Multiple  0   Live Births  0           Social History Social History    Socioeconomic History   Marital status: Single    Spouse name: Not on file   Number of children: Not on file   Years of education: 12   Highest education level: Not on file  Occupational History   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    Comment: [employer]  Tobacco Use   Smoking status: Never Smoker   Smokeless tobacco: Never Used  Substance and Sexual Activity   Alcohol use: No   Drug use: No   Sexual activity: Yes    Partners: Male  Other Topics Concern   Not on file  Social History Narrative   Not on file   Social Determinants of Health   Financial Resource Strain:    Difficulty of Paying Living Expenses:   Food Insecurity:    Worried About RuCharity fundraisern the Last Year:    RaArboriculturistn the Last Year:   Transportation Needs:    Lack of Transportation (Medical):    Lack of Transportation (Non-Medical):   Physical Activity:    Days of Exercise per  Week:    Minutes of Exercise per Session:   Stress:    Feeling of Stress :   Social Connections:    Frequency of Communication with Friends and Family:    Frequency of Social Gatherings with Friends and Family:    Attends Religious Services:    Active Member of Clubs or Organizations:    Attends Music therapist:    Marital Status:     Family History: Family History  Problem Relation Age of Onset   Cancer Mother 73       Passed away from breast cancer at 21 years old   Cancer Maternal Grandmother        Cancer at young age, and then passed away from cancer at 21 years old   Healthy Father    Kidney disease Maternal Grandfather    Kidney disease Paternal Aunt     Allergies: No Known Allergies  Medications Prior to Admission  Medication Sig Dispense Refill Last Dose   aspirin EC 81 MG tablet Take 1 tablet (81 mg total) by mouth daily. Take after 12 weeks for prevention of preeclampsia later in pregnancy 300 tablet 0 06/04/2019 at Unknown time   [place]    06/04/2019 at Unknown time   Prenatal MV-Min-FA-Omega-3 (PRENATAL GUMMIES/DHA & FA) 0.4-32.5 MG CHEW Chew 3 tablets by mouth daily. 90 tablet 11 06/04/2019 at Unknown time   acetaminophen (TYLENOL) 325 MG tablet Take 2 tablets (650 mg total) by mouth every 4 (four) hours as needed (for pain scale < 4). (Patient not taking: Reported on 03/25/2019)      Blood Pressure Monitoring (BLOOD PRESSURE KIT) DEVI 1 kit by Does not apply route as needed. 1 each 0    HYDROXYprogesterone Caproate (MAKENA IM) Inject into the muscle.      terconazole (TERAZOL 7) 0.4 % vaginal cream Place 1 applicator vaginally at bedtime. Use for seven days (Patient not taking: Reported on 05/29/2019) 45 g 0      Review of Systems   All systems reviewed and negative except as stated in HPI  Blood pressure (!) 122/52, pulse 89, temperature (!) 97.4 F (36.3 C), resp. rate 20, height 5' 3"  (1.6 m), weight 98.9 kg, last menstrual period 01/15/2019. General appearance: alert, cooperative, appears stated age and no distress Lungs: clear to auscultation bilaterally Heart: regular rate and rhythm Abdomen: soft, non-tender; bowel sounds normal Pelvic: deferred to MAU Extremities: no sign of DVT Presentation: cephalic Dilation: 6.5(BY [place] measurement) Exam by:: m.williams cnm   Prenatal labs: ABO, Rh: --/--/PENDING (04/30 0244) Antibody: PENDING (04/30 0244) Rubella: 2.32 (02/25 1422) RPR: Non Reactive (02/25 1422)  HBsAg: Negative (02/25 1422)  HIV: Non Reactive (02/25 1422)  GBS:   n/a 1 hr Glucola n/a Genetic screening  WNL Anatomy US: EFW 2%  Prenatal Transfer Tool  Maternal Diabetes: No Genetic Screening: Normal Maternal Ultrasounds/Referrals: Normal Fetal Ultrasounds or other Referrals:  None Maternal Substance Abuse:  No Significant Maternal Medications:  17P injections weekly Significant Maternal Lab Results: None  Results for orders placed or performed during the hospital encounter of 06/05/19 (from the  past 24 hour(s))  Respiratory Panel by RT PCR (Flu A&B, Covid) - Nasopharyngeal Swab   Collection Time: 06/05/19  2:01 AM   Specimen: Nasopharyngeal Swab  Result Value Ref Range   SARS Coronavirus 2 by RT PCR NEGATIVE NEGATIVE   Influenza A by PCR NEGATIVE NEGATIVE   Influenza B by PCR NEGATIVE NEGATIVE  Wet prep, genital  Collection Time: 06/05/19  2:35 AM  Result Value Ref Range   Yeast Wet Prep HPF POC NONE SEEN NONE SEEN   Trich, Wet Prep NONE SEEN NONE SEEN   Clue Cells Wet Prep HPF POC NONE SEEN NONE SEEN   WBC, Wet Prep HPF POC MODERATE (A) NONE SEEN   Sperm NONE SEEN   CBC on admission   Collection Time: 06/05/19  2:44 AM  Result Value Ref Range   WBC 13.4 (H) 4.0 - 10.5 K/uL   RBC 4.21 3.87 - 5.11 MIL/uL   Hemoglobin 12.2 12.0 - 15.0 g/dL   HCT 36.1 36.0 - 46.0 %   MCV 85.7 80.0 - 100.0 fL   MCH 29.0 26.0 - 34.0 pg   MCHC 33.8 30.0 - 36.0 g/dL   RDW 12.4 11.5 - 15.5 %   Platelets 165 150 - 400 K/uL   nRBC 0.0 0.0 - 0.2 %  Type and screen [place]   Collection Time: 06/05/19  2:44 AM  Result Value Ref Range   ABO/RH(D) PENDING    Antibody Screen PENDING    Sample Expiration      06/08/2019,2359 Performed at [facility], [address] [place], [place] [address]     Patient Active Problem List   Diagnosis Date Noted   Preterm labor in second trimester 06/05/2019   Hx of preterm delivery, currently pregnant 04/02/2019   Obesity during pregnancy, antepartum 04/02/2019   Supervision of other high risk pregnancy, antepartum 03/25/2019   Family history of breast cancer 09/06/2017   Hx of seizure disorder 02/21/2016    Assessment/Plan:  Kathy Bell is a 19 y.o. G2P0000 at 66w1dhere for SOL for preterm labor in 2nd trimester.  #Labor: Expectant management. Anticipate vaginal delivery. Will counsel on possibility of D&C due to premature delivery, although she did not need one for her previous delivery. If labor stabilizes,  consider MFM consult. Admission labs including CBC and type/screen ordered, infection screening #Pain: As patient requests #FWB: n/a as fetus is nonviable at this moment #ID: GBS n/a. GC/chlamydia ordered. #MOF: n/a #Circ: n/a  #postpartum: if patient becomes pregnant again, will likely need cerclage  RAlroy Bailiff DO  06/05/2019, 3:15 AM   I personally saw and evaluated the patient, performing the key elements of the service. I developed and verified the management plan that is described in the resident's/student's note, and I agree with the content with my edits above. VSS, HRR&R, Resp unlabored, Legs neg.  FNigel Berthold CNM 06/05/2019 7:24 AM

## 2019-06-05 NOTE — Anesthesia Procedure Notes (Signed)
Epidural Patient location during procedure: OB Start time: 06/05/2019 6:44 AM  Staffing Anesthesiologist: Trevor Iha, MD Performed: anesthesiologist   Preanesthetic Checklist Completed: patient identified, IV checked, site marked, risks and benefits discussed, surgical consent, monitors and equipment checked, pre-op evaluation and timeout performed  Epidural Patient position: right lateral decubitus Prep: DuraPrep and site prepped and draped Patient monitoring: continuous pulse ox and blood pressure Approach: midline Location: L3-L4 Injection technique: LOR air  Needle:  Needle type: Tuohy  Needle gauge: 17 G Needle length: 9 cm and 9 Needle insertion depth: 5 cm cm Catheter type: closed end flexible Catheter size: 19 Gauge Catheter at skin depth: 10 cm Test dose: negative  Assessment Events: blood not aspirated, injection not painful, no injection resistance, no paresthesia and negative IV test  Additional Notes Patient identified. Risks/Benefits/Options discussed with patient including but not limited to bleeding, infection, nerve damage, paralysis, failed block, incomplete pain control, headache, blood pressure changes, nausea, vomiting, reactions to medication both or allergic, itching and postpartum back pain. Confirmed with bedside nurse the patient's most recent platelet count. Confirmed with patient that they are not currently taking any anticoagulation, have any bleeding history or any family history of bleeding disorders. Patient expressed understanding and wished to proceed. All questions were answered. Sterile technique was used throughout the entire procedure. Please see nursing notes for vital signs. Test dose was given through epidural needle and negative prior to continuing to dose epidural or start infusion. Warning signs of high block given to the patient including shortness of breath, tingling/numbness in hands, complete motor block, or any concerning  symptoms with instructions to call for help. Patient was given instructions on fall risk and not to get out of bed. All questions and concerns addressed with instructions to call with any issues. 1 Attempt (S) . Patient tolerated procedure well.

## 2019-06-05 NOTE — Progress Notes (Signed)
I offered support to [facility] and brought her information on counseling at her request.  She has good self-awareness that she needs support that she wasn't able to seek out after the loss of her previous baby.  She has information for follow up resources.  Chaplain Dyanne Carrel, Bcc Pager, [phone] 4:55 PM    06/05/19 1600  Clinical Encounter Type  Visited With Patient  Visit Type Spiritual support  Spiritual Encounters  Spiritual Needs Grief support

## 2019-06-05 NOTE — Progress Notes (Signed)
Labor Progress Note Kathy Bell is a 18 y.o. G3P0010 at [redacted]w[redacted]d presented for preterm labor   S: Patient would like to know if there is anything that can be done for the baby  O:  BP (!) 124/54   Pulse (!) 103   Temp 98.1 F (36.7 C) (Oral)   Resp 18   Ht 5\' 3"  (1.6 m)   Wt 98.9 kg   LMP 01/15/2019   SpO2 100%   BMI 38.62 kg/m    Exam not completed.  Dilation: 10 Dilation Complete Date: 06/05/19 Dilation Complete Time: 0901 Effacement (%): 100 Station: 0 Presentation: Undeterminable(small parts felt ) Exam by:: Dr. [email or web address]    A&P: 19 y.o. G3P0010 [redacted]w[redacted]d for preterm labor #Labor: Patient wanting to know if anything can be done for the baby. Discussed that viability starts at 22wks at some institutions but well below this at present. She would like to discuss with Neonatology for peace of mind, consult placed and Dr. [redacted]w[redacted]d to see patient.   Plan to push after consultation.  #Pain: Epidural  Burnadette Pop, MD 10:40 AM

## 2019-06-05 NOTE — Progress Notes (Signed)
Labor Progress Note Kathy Bell is a 19 y.o. G3P0010 at [redacted]w[redacted]d presented for PPROM and preterm labor   S: Patient sleeping comfortably  O:  BP (!) 111/37   Pulse 92   Temp 98 F (36.7 C) (Oral)   Resp 18   Ht 5\' 3"  (1.6 m)   Wt 98.9 kg   LMP 01/15/2019   BMI 38.62 kg/m   CVE: Dilation: 6.5(BY 14/11/2018 measurement) Exam by:: m.williams cnm   A&P: 19 y.o. G3P0010 [redacted]w[redacted]d for preterm labor #Labor: Expectant management. Anticipate vaginal delivery of nonviable infant. Counseled on possibility of D&C due to premature delivery, although she did not need one for her previous delivery. If labor stabilizes, consider MFM consult.  #Pain: As patient requests  [redacted]w[redacted]d, DO 6:11 AM

## 2019-06-05 NOTE — Progress Notes (Signed)
Labor Progress Note Kathy Bell is a 19 y.o. G3P0010 at [redacted]w[redacted]d presented for preterm labor   S: Patient sleeping comfortably. Upon awakening says epidural is working well  O:  BP (!) 96/44   Pulse 93   Temp 98 F (36.7 C) (Oral)   Resp 18   Ht 5\' 3"  (1.6 m)   Wt 98.9 kg   LMP 01/15/2019   SpO2 96%   BMI 38.62 kg/m   CVE: 10/100/0, bulging bag palpated in vagina with fetal small parts mobile in the bag   A&P: 18 y.o. G3P0010 [redacted]w[redacted]d for preterm labor #Labor: Discussed with patient that delivery now inevitable, offered time to process/expectant management vs starting to push, she would like some time to think.  Small parts felt through bag, unclear presentation though clearly not vertex. #Pain: Epidural  [redacted]w[redacted]d, MD 9:07 AM

## 2019-06-06 MED ORDER — IBUPROFEN 800 MG PO TABS: 800.0000 mg | ORAL_TABLET | Freq: Three times a day (TID) | ORAL | 0 refills | Status: DC | PRN

## 2019-06-06 MED ORDER — OXYCODONE HCL 5 MG PO TABS: 5.0000 mg | ORAL_TABLET | ORAL | 0 refills | Status: DC | PRN

## 2019-06-06 NOTE — Anesthesia Postprocedure Evaluation (Signed)
Anesthesia Post Note  Patient: Kathy Bell  Procedure(s) Performed: AN AD HOC LABOR EPIDURAL     Patient location during evaluation: Mother Baby Anesthesia Type: Epidural Level of consciousness: awake and alert, oriented and patient cooperative Pain management: pain level controlled Vital Signs Assessment: post-procedure vital signs reviewed and stable Respiratory status: spontaneous breathing Cardiovascular status: stable Postop Assessment: no headache, epidural receding, patient able to bend at knees and no signs of nausea or vomiting Anesthetic complications: no Comments: Pt. States she is walking. Pain score 3.      Last Vitals:  Vitals:   06/05/19 2317 06/06/19 0348  BP: (!) 122/47 (!) 124/49  Pulse: 91 92  Resp: 18 18  Temp: 36.9 C 36.7 C  SpO2: 100% 100%    Last Pain:  Vitals:   06/06/19 0640  TempSrc:   PainSc: 4    Pain Goal: Patients Stated Pain Goal: 2 (06/06/19 0640)                 Merrilyn Puma

## 2019-06-09 LAB — SURGICAL PATHOLOGY

## 2019-06-10 ENCOUNTER — Encounter: Payer: Self-pay | Admitting: Certified Nurse Midwife

## 2019-06-10 ENCOUNTER — Other Ambulatory Visit: Payer: Self-pay

## 2019-06-10 ENCOUNTER — Ambulatory Visit (INDEPENDENT_AMBULATORY_CARE_PROVIDER_SITE_OTHER): Payer: Medicaid Other | Admitting: Certified Nurse Midwife

## 2019-06-10 VITALS — BP 127/77 | HR 78 | Wt 219.3 lb

## 2019-06-10 DIAGNOSIS — O3432 Maternal care for cervical incompetence, second trimester: Secondary | ICD-10-CM

## 2019-06-10 NOTE — Progress Notes (Signed)
Patient ID: Kathy Bell, female   DOB: November 05, 2000, 19 y.o.   MRN: [id number]  Patient here today for mood check following preterm delivery on 4/30. Patient delivered a nonviable fetal at [redacted]w[redacted]d. Patient has a hx of preterm delivery at 16 weeks d/t PPROM. This delivery was after spontaneous labor and cervical insufficiency. Patient was receiving weekly 17P injections.   Patient doing okay. Patient is grieving appropriately. Funeral for baby girl is on Saturday. Has great family support. Support given to patient in office today and encouraged patient to call at any time if she needs additional support or someone to talk to. Patient has moments that she is sad but can continue to perform ADLs. She denies SI or HI.   Patient does not want to become pregnant again for "awhile". Will discuss contraception at [facility] appointment in 3 weeks. Educated and discussed with patient that when she does want to become pregnant again that cervical cerclage and 17P will be important to help prevent this from occurring again, patient verbalizes understanding.   Comfort and support given to patient, will follow up in 3 weeks for postpartum appointment.   1. Cervical insufficiency during pregnancy in second trimester, antepartum   2. Preterm spontaneous labor with preterm delivery, single or unspecified fetus     Sharyon Cable, CNM 06/10/19, 4:41 PM

## 2019-06-10 NOTE — Progress Notes (Signed)
Pt is here for follow up after pre term delivery at [redacted]w[redacted]d.

## 2019-06-11 DIAGNOSIS — H5213 Myopia, bilateral: Secondary | ICD-10-CM | POA: Diagnosis not present

## 2019-06-23 ENCOUNTER — Telehealth: Payer: Medicaid Other | Admitting: Nurse Practitioner

## 2019-06-26 ENCOUNTER — Ambulatory Visit: Payer: Medicaid Other

## 2019-06-26 ENCOUNTER — Ambulatory Visit (HOSPITAL_COMMUNITY): Payer: Medicaid Other

## 2019-07-02 ENCOUNTER — Ambulatory Visit: Payer: Medicaid Other | Admitting: Certified Nurse Midwife

## 2019-07-27 DIAGNOSIS — H5213 Myopia, bilateral: Secondary | ICD-10-CM | POA: Diagnosis not present

## 2019-08-10 IMAGING — US US MFM FETAL NUCHAL TRANSLUCENCY
1 series · 14 of 28 positions shown · non-contrast
Comparison: none

[Series 1: us mfm fetal nuchal translucency · 14 of 54 slices shown]
[im 2/54]
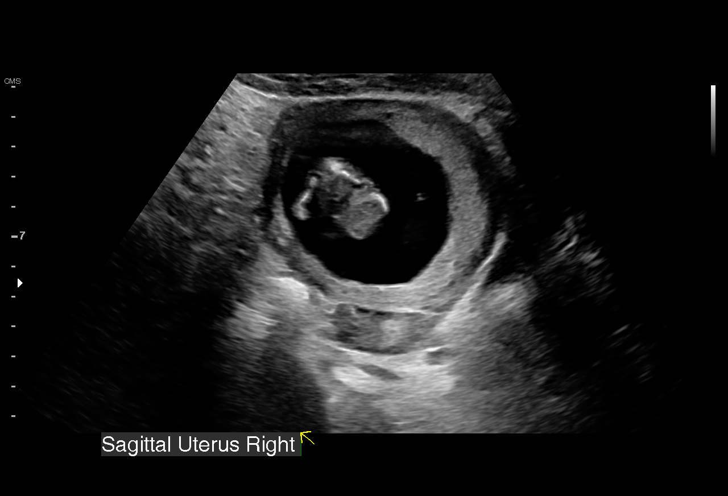
[im 6/54]
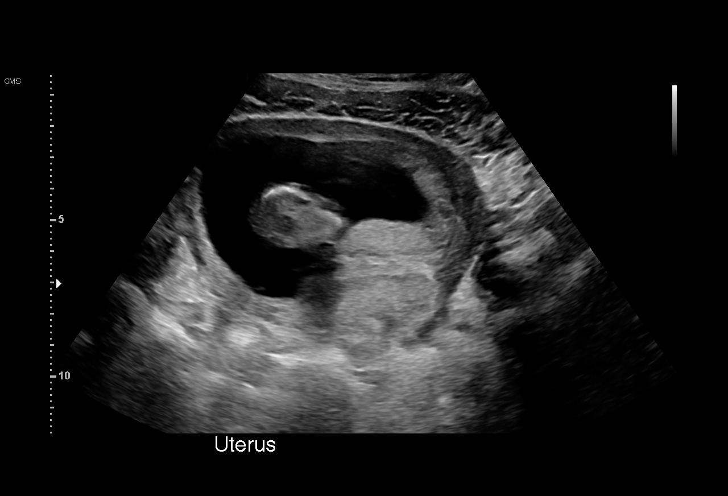
[im 10/54]
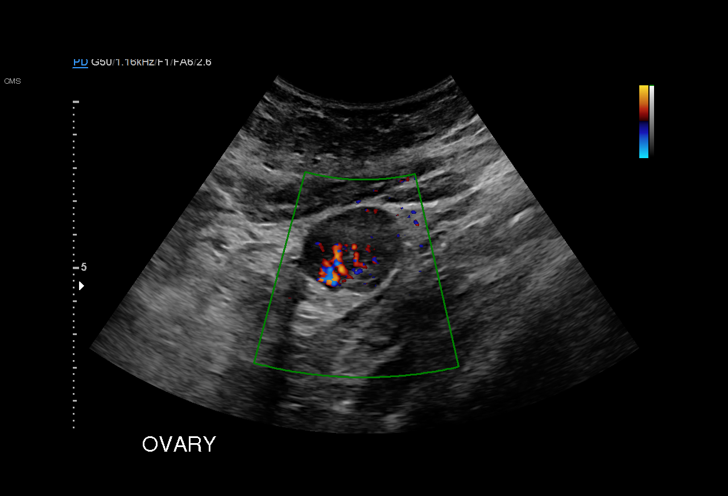
[im 14/54]
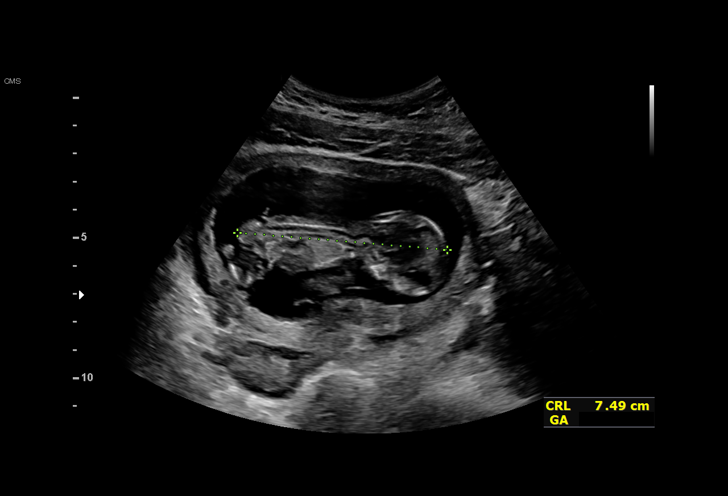
[im 18/54]
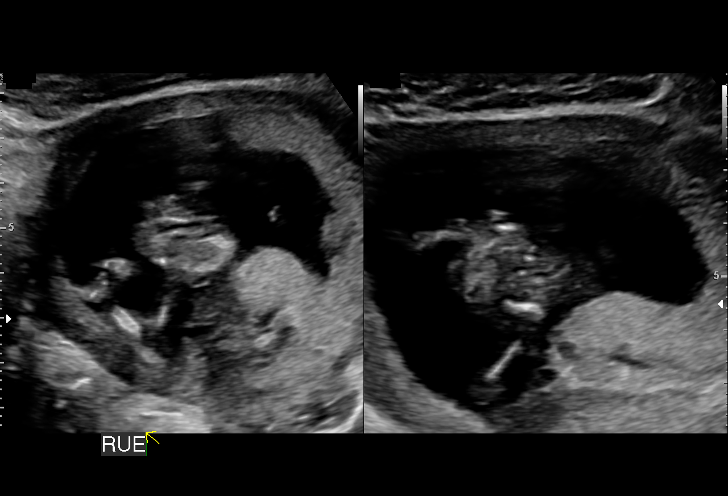
[im 22/54]
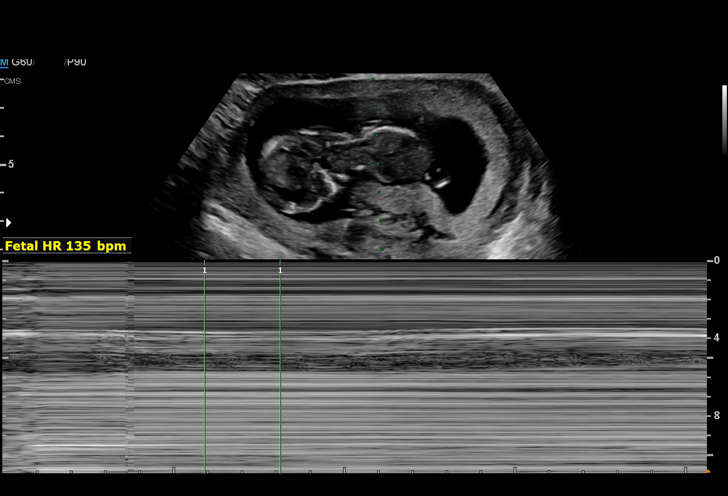
[im 26/54]
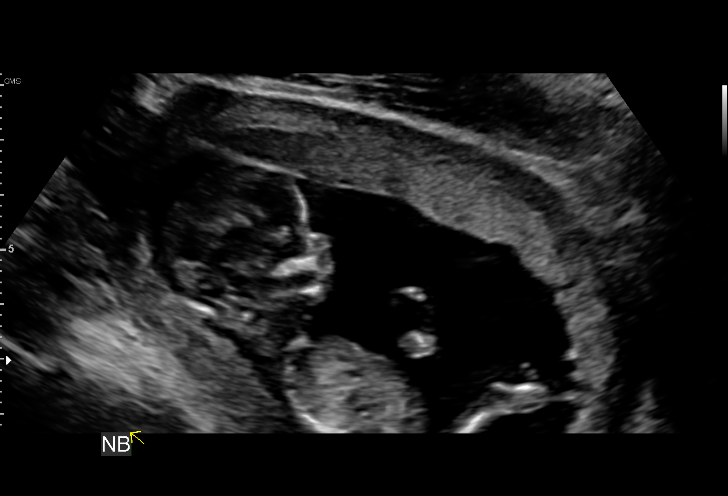
[im 30/54]
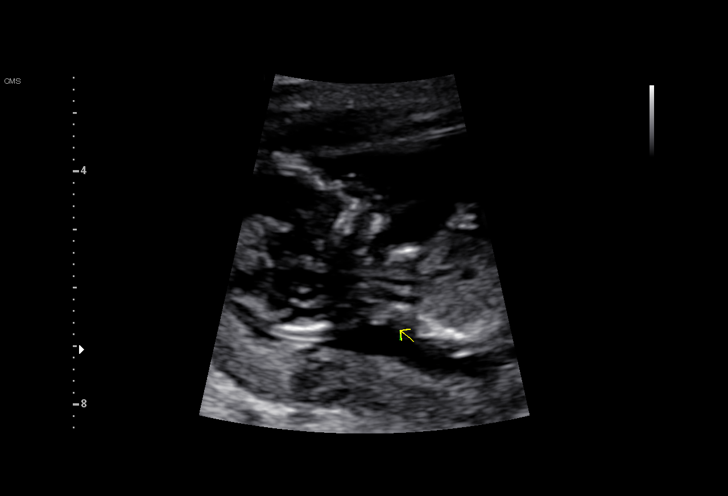
[im 34/54]
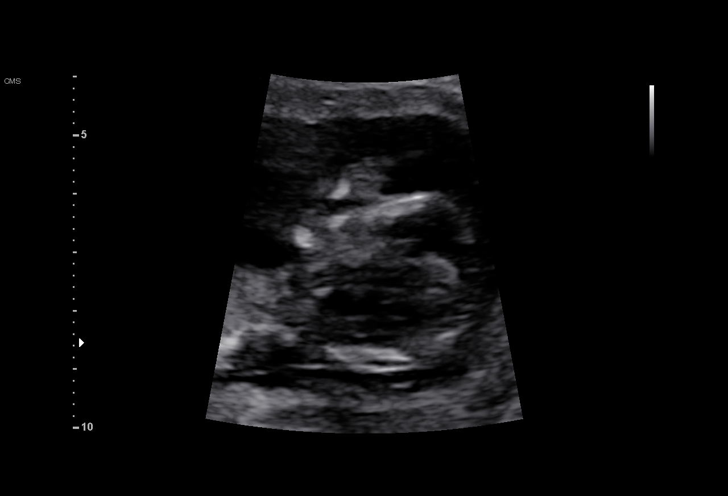
[im 38/54]
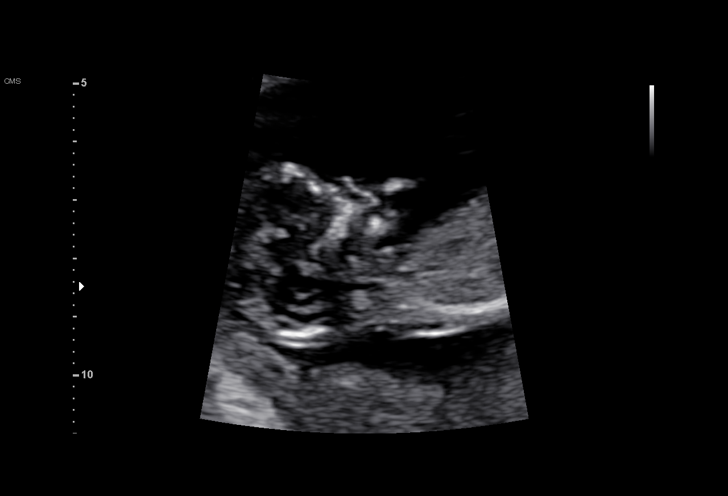
[im 42/54]
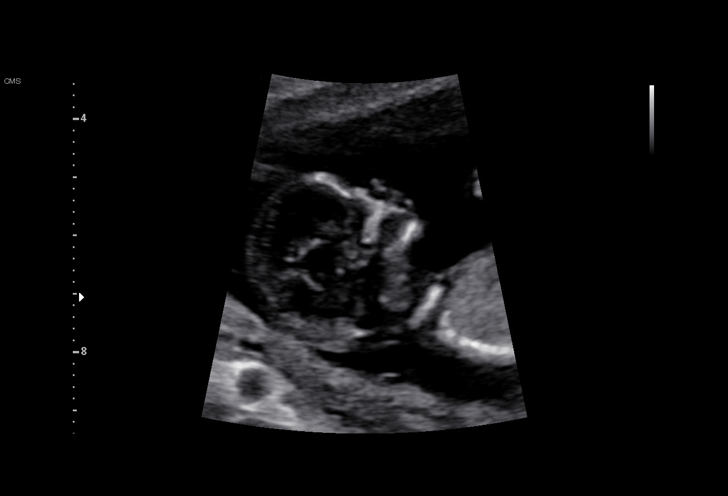
[im 46/54]
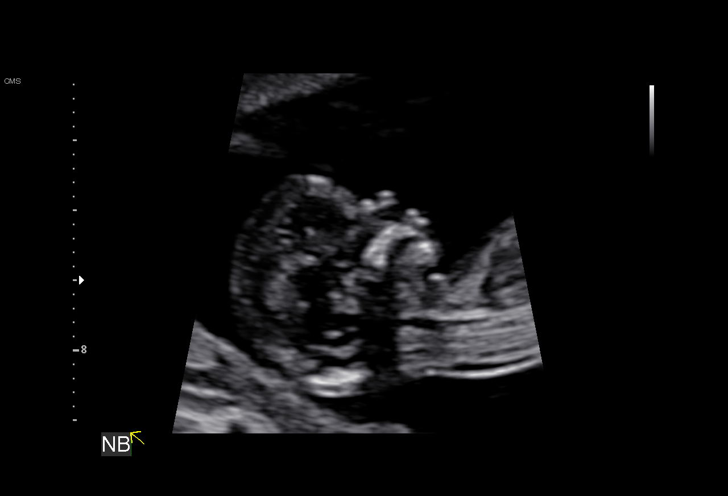
[im 50/54]
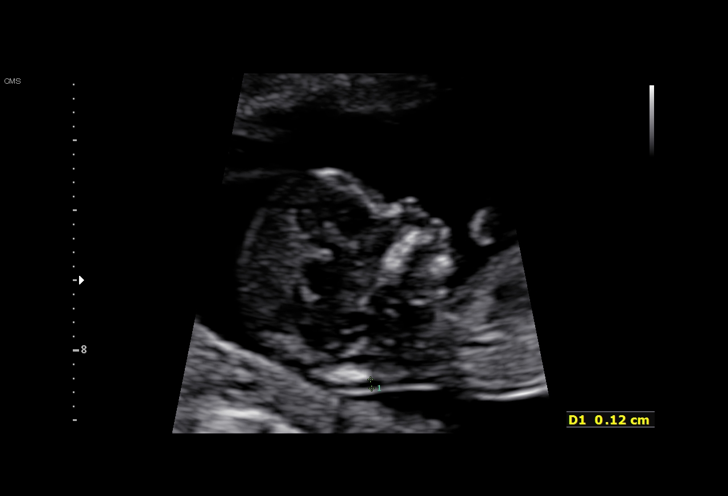
[im 54/54]
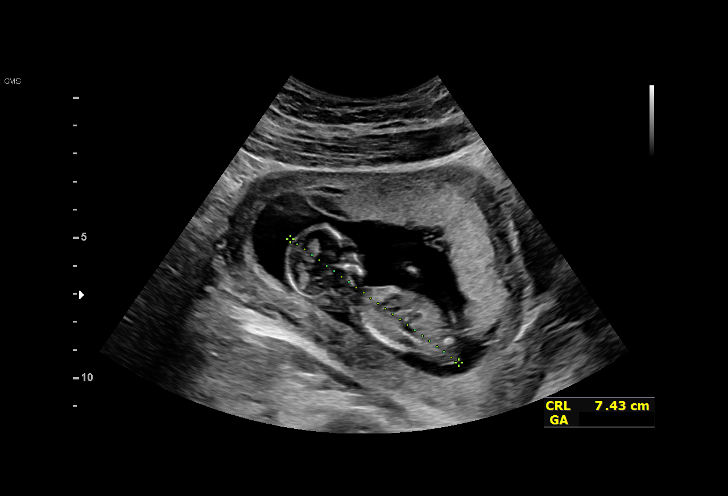

[14 of 28 positions shown; findings below may reference images not displayed]

[REDACTED]care - [HOSPITAL]

     TRANSLUCENCY
 ----------------------------------------------------------------------

 ----------------------------------------------------------------------
Indications

  Encounter for nuchal translucency
  Seizure disorder
  Obesity complicating pregnancy, first
  trimester
  13 weeks gestation of pregnancy
 ----------------------------------------------------------------------
Fetal Evaluation

 Num Of Fetuses:         1
 Preg. Location:         Intrauterine
 Gest. Sac:              Intrauterine
 Fetal Pole:             Visualized
 Fetal Heart Rate(bpm):  135
 Cardiac Activity:       Observed

 Amniotic Fluid
 AFI FV:      Within normal limits
Biometry

 CRL:      71.4  mm     G. Age:  13w 1d                  EDD:   09/15/18
OB History

 Gravidity:    1         Term:   0        Prem:   0        SAB:   0
 TOP:          0       Ectopic:  0        Living: 0
Gestational Age
 LMP:           19w 0d        Date:  10/29/17                 EDD:   08/05/18
 Best:          13w 0d     Det. By:  Early Ultrasound         EDD:   09/16/18
                                     (02/10/18)
1st Trimester Genetic Sonogram Screening

 CRL:            71.4  mm    G. Age:   13w 1d                 EDD:   09/15/18
 Nuc Trans:       1.4  mm
 Nasal Bone:                 Present
Anatomy

 Cranium:               Appears normal         Cord Vessels:           Appears normal (3
                                                                       vessel cord)
 Choroid Plexus:        Appears normal         Bladder:                Appears normal
 Stomach:               Appears normal, left   Upper Extremities:      Visualized
                        sided
 Abdomen:               Appears normal         Lower Extremities:      Visualized
 Abdominal Wall:        Appears nml (cord
                        insert, abd wall)
Cervix Uterus Adnexa

 Cervix
 Closed

 Uterus
 No abnormality visualized.

 Left Ovary
 Within normal limits.

 Right Ovary
 Within normal limits.

 Cul De Sac
 No free fluid seen.

 Adnexa
 No abnormality visualized.
Impression

 On ultrasound, the CRL measurement is consistent with her
 previously-established dates and good fetal heart activity is
 seen. The nuchal translucency (NT) measures
 millimeters, which is normal.  Fetal anatomy that could be
 ascertained at this gestational age is normal.

 Blood was drawn for LENNY and beta hCG. We will
 communicate first-trimester screening results to the patient.
Recommendations

 Fetal anatomy scan at 20 weeks.
                 Auad, Relindas

## 2019-09-03 IMAGING — US US MFM OB DETAIL+14 WK
1 series · 13 of 28 positions shown · non-contrast
Comparison: none

[REDACTED]care - [HOSPITAL]

                                                       [place]
 ----------------------------------------------------------------------
Indications
  Vaginal bleeding in pregnancy, second
  trimester
  16 weeks gestation of pregnancy
  Seizure disorder
  Obesity complicating pregnancy, second
  Encounter for antenatal screening for
  malformations
Vital Signs
 BMI:
Fetal Evaluation
 Num Of Fetuses:         1
 Fetal Heart Rate(bpm):  152
 Cardiac Activity:       Observed
 Presentation:           Transverse, head to maternal right
 Placenta:               Anterior, low-lying, 0.6 cm from int os
 P. Cord Insertion:      Not well visualized
 Amniotic Fluid
 AFI FV:      Anhydramnios
Biometry
 BPD:      33.7  mm     G. Age:  16w 3d         48  %    CI:        70.18   %    70 - 86
                                                         FL/HC:      17.4   %    13.3 -
 HC:      128.3  mm     G. Age:  16w 4d         43  %    HC/AC:      1.24        1.05 -
 AC:      103.7  mm     G. Age:  16w 3d         48  %    FL/BPD:     66.2   %
 FL:       22.3  mm     G. Age:  16w 5d         55  %    FL/AC:      21.5   %    20 - 24
 HUM:        21  mm     G. Age:  16w 2d         51  %
 CER:      15.2  mm     G. Age:  15w 1d         22  %
 LV:        5.5  mm
 CM:        5.1  mm
 Est. FW:     159  gm      0 lb 6 oz     63  %
OB History
 Gravidity:    1         Term:   0        Prem:   0        SAB:   0
 TOP:          0       Ectopic:  0        Living: 0
Gestational Age
 LMP:           22w 3d        Date:  10/29/17                 EDD:   08/05/18
 U/S Today:     16w 4d                                        EDD:   09/15/18
 Best:          16w 3d     Det. By:  Early Ultrasound         EDD:   09/16/18
                                     (02/10/18)
Anatomy
 Cranium:               Appears normal         Aortic Arch:            Not well visualized
 Cavum:                 Appears normal         Ductal Arch:            Appears normal
 Ventricles:            Appears normal         Diaphragm:              Not well visualized
 Choroid Plexus:        Appears normal         Stomach:                Appears normal, left
                                                                       sided
 Cerebellum:            Appears normal         Abdomen:                Appears normal
 Posterior Fossa:       Appears normal         Abdominal Wall:         Not well visualized
 Nuchal Fold:           Not well visualized    Cord Vessels:           Appears normal (3
                                                                       vessel cord)
 Face:                  Not well visualized    Kidneys:                Appear normal
 Lips:                  Not well visualized    Bladder:                Appears normal
 Palate:                Not well visualized    Spine:                  Not well visualized
 Heart:                 Not well visualized    Upper Extremities:      RT Vis; Lt R/U not
                                                                       well vis
 RVOT:                  Not well visualized    Lower Extremities:      RT Vis; Lt T/F not
 LVOT:                  Not well visualized
 Other:  Technically difficult due to low amniotic fluid and fetal position.
Cervix Uterus Adnexa
 Cervix
 Length:            3.5  cm.
 Normal appearance by transabdominal scan.
 Uterus
 No abnormality visualized.
 Left Ovary
 Within normal limits.
 Right Ovary
Impression
 Patient is being evaluated in the [facility] for c/o leakage of
 amniotic fluid.
 On ultrasound, no measurable pocket of amniotic fluid is
 seen (anhydramnios). Fetal biometry is consistent with her
 previously-established dates and good fetal heart activity is
 seen. Fetal anatomical survey is very limited because of
 anhydramnios.Placenta is anterior and low lying.

[Series 1: us mfm ob detail+14 wk · 13 of 62 slices shown]
[im 3/62]
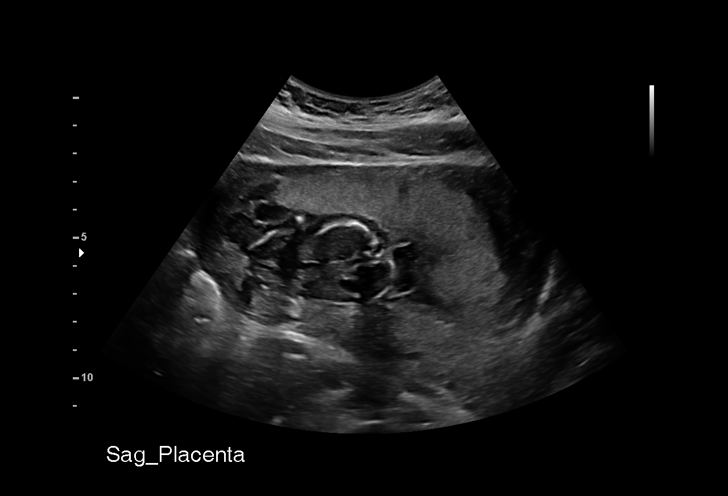
[im 7/62]
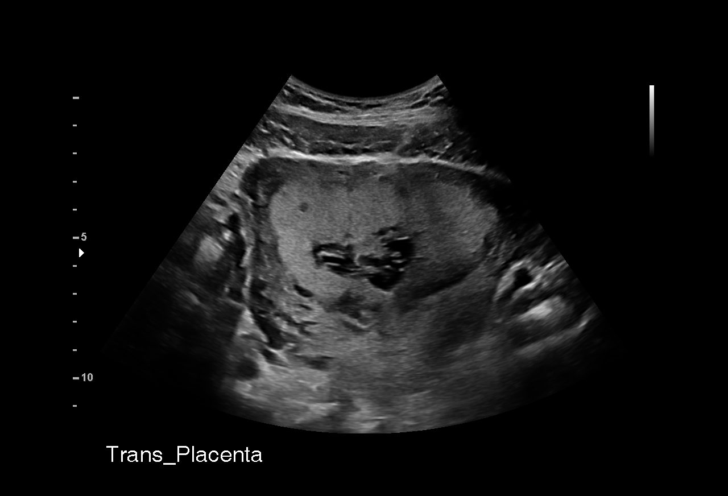
[im 12/62]
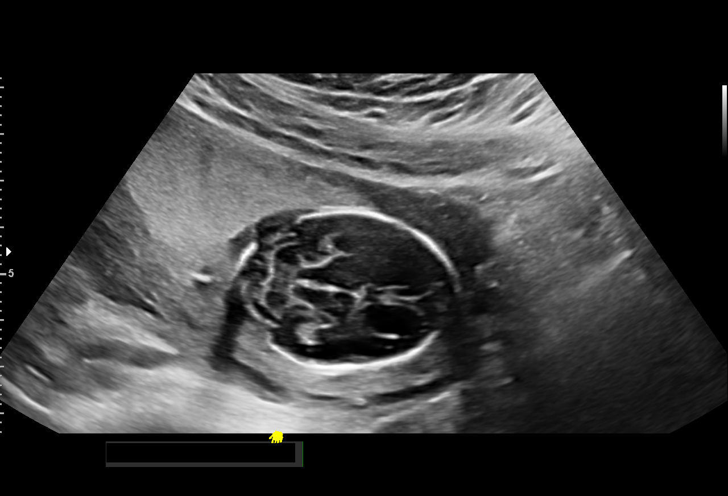
[im 16/62]
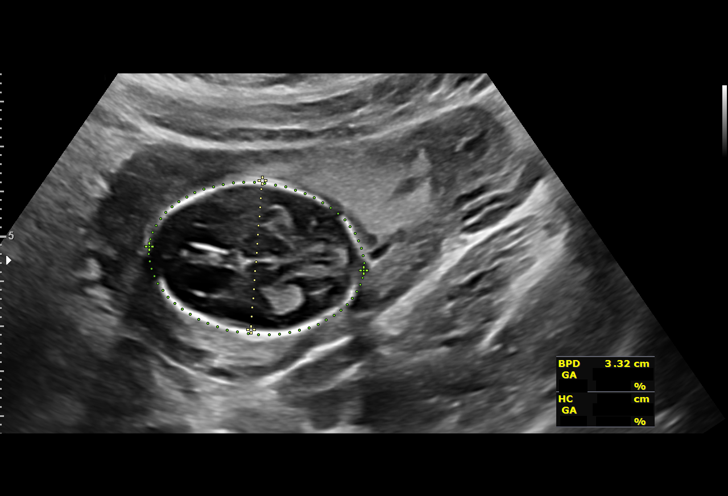
[im 21/62]
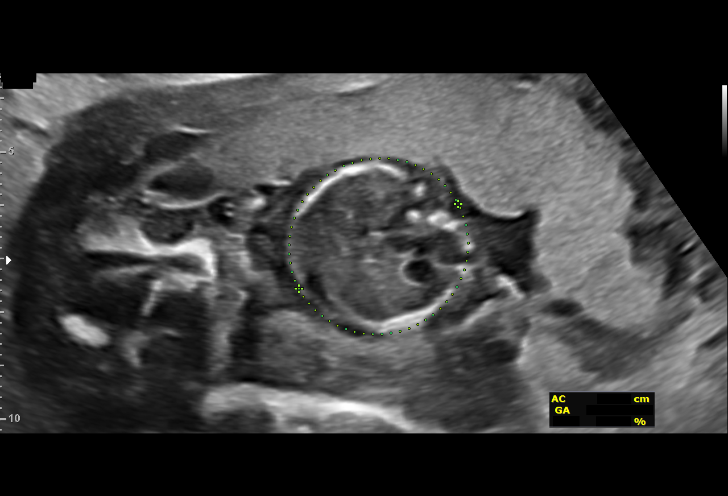
[im 25/62]
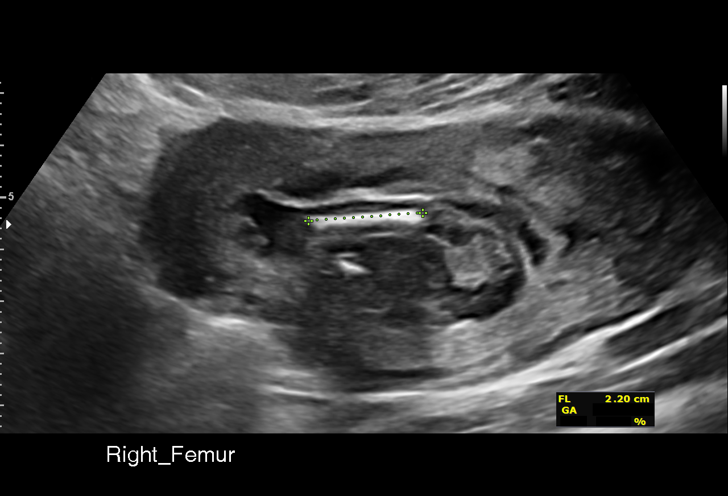
[im 32/62]
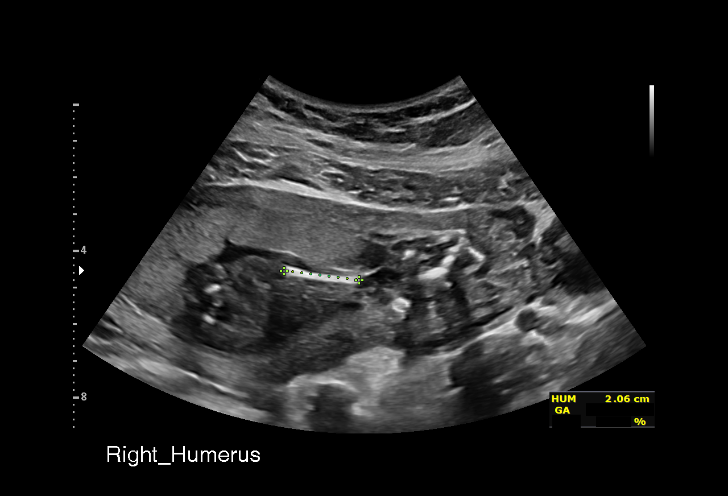
[im 37/62]
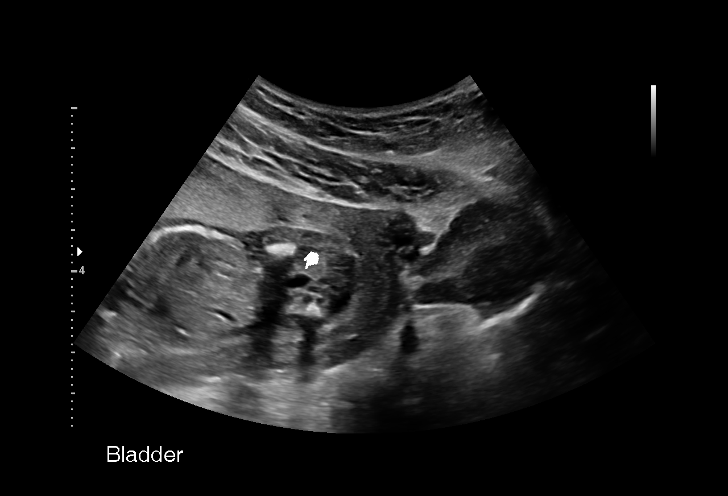
[im 41/62]
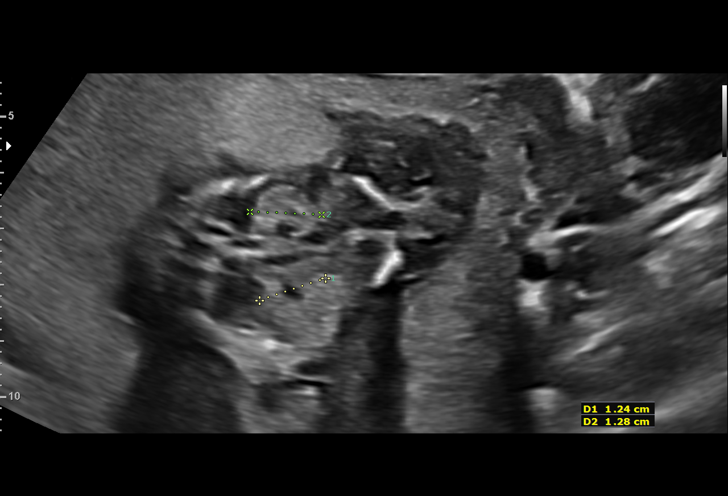
[im 46/62]
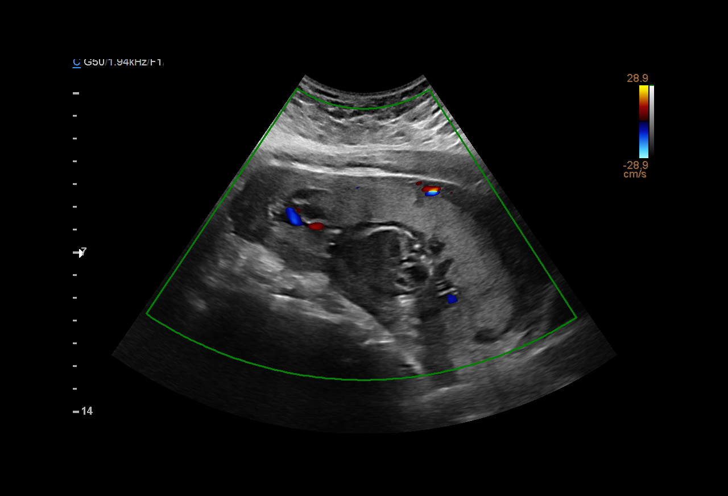
[im 50/62]
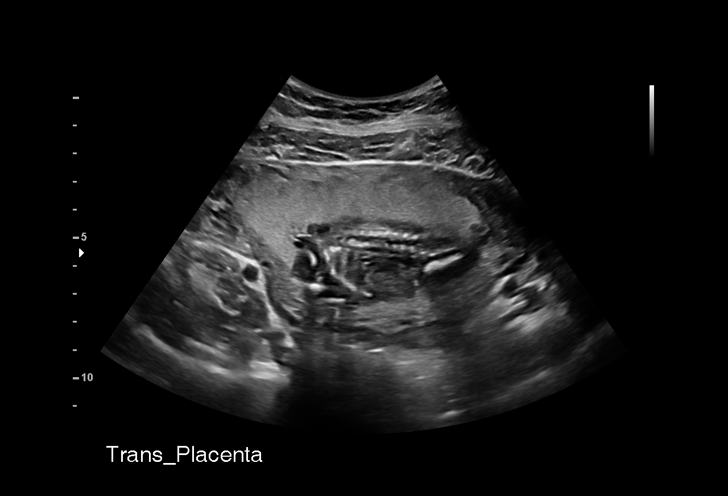
[im 55/62]
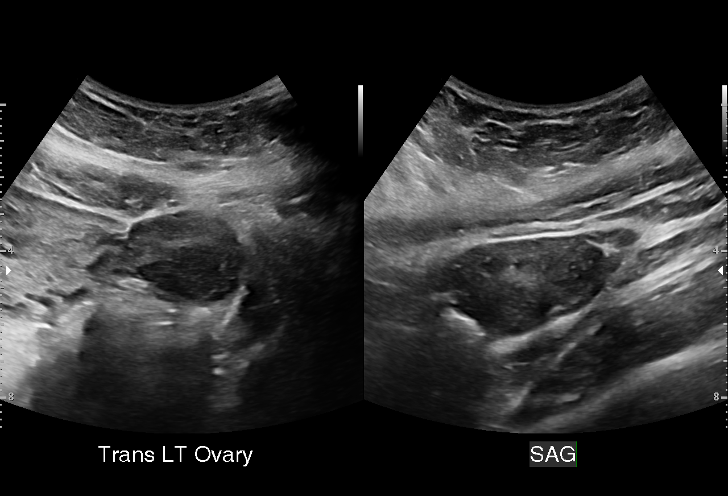
[im 59/62]
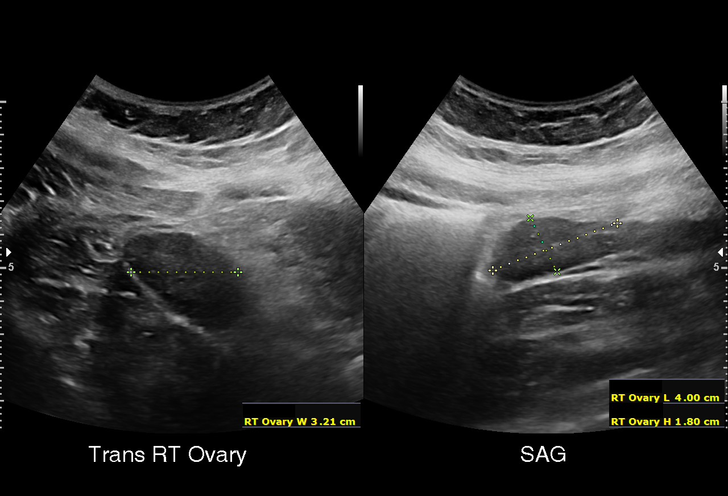

[13 of 28 positions shown; findings below may reference images not displayed]

IMPRESSION: Very early PPROM.

 Very early PPROM can be associated with pulmonary
 hypoplasia and poor neonatal outcome. She has an
 increased risk of miscarriage. Role of antibiotics is not clear
 and is not indicated if she does not have infection now.

 Patient has an option of termination of pregnancy. If she is
 planning to continue her pregnancy, weekly fetal heat checks
 at your office is sufficient. Recommend readmission at 23 to
 24 weeks for steroids and management till delivery.

                 Amazigh, Quirijn

## 2019-10-10 DIAGNOSIS — R109 Unspecified abdominal pain: Secondary | ICD-10-CM | POA: Diagnosis not present

## 2019-10-10 DIAGNOSIS — Z202 Contact with and (suspected) exposure to infections with a predominantly sexual mode of transmission: Secondary | ICD-10-CM | POA: Diagnosis not present

## 2019-10-10 DIAGNOSIS — Z3202 Encounter for pregnancy test, result negative: Secondary | ICD-10-CM | POA: Diagnosis not present

## 2020-02-15 DIAGNOSIS — Z03818 Encounter for observation for suspected exposure to other biological agents ruled out: Secondary | ICD-10-CM | POA: Diagnosis not present

## 2020-02-15 DIAGNOSIS — Z20822 Contact with and (suspected) exposure to covid-19: Secondary | ICD-10-CM | POA: Diagnosis not present

## 2020-03-15 DIAGNOSIS — Z0000 Encounter for general adult medical examination without abnormal findings: Secondary | ICD-10-CM | POA: Diagnosis not present

## 2020-03-15 DIAGNOSIS — Z124 Encounter for screening for malignant neoplasm of cervix: Secondary | ICD-10-CM | POA: Diagnosis not present

## 2020-03-15 DIAGNOSIS — Z6837 Body mass index (BMI) 37.0-37.9, adult: Secondary | ICD-10-CM | POA: Diagnosis not present

## 2020-03-15 DIAGNOSIS — Z3009 Encounter for other general counseling and advice on contraception: Secondary | ICD-10-CM | POA: Diagnosis not present

## 2020-03-15 DIAGNOSIS — N883 Incompetence of cervix uteri: Secondary | ICD-10-CM | POA: Diagnosis not present

## 2020-06-23 DIAGNOSIS — N946 Dysmenorrhea, unspecified: Secondary | ICD-10-CM | POA: Diagnosis not present

## 2020-06-23 DIAGNOSIS — Z8759 Personal history of other complications of pregnancy, childbirth and the puerperium: Secondary | ICD-10-CM | POA: Diagnosis not present

## 2020-06-23 DIAGNOSIS — N939 Abnormal uterine and vaginal bleeding, unspecified: Secondary | ICD-10-CM | POA: Diagnosis not present

## 2020-06-24 ENCOUNTER — Telehealth: Payer: Self-pay

## 2020-06-24 NOTE — Telephone Encounter (Signed)
Transition Care Management Follow-up Telephone Call  Date of discharge and from where: 06/23/2020 from [facility]  How have you been since you were released from the hospital? Pt stated that she is feeling much better and does not have any questions at this time.   Any questions or concerns? No  Items Reviewed:  Did the pt receive and understand the discharge instructions provided? Yes   Medications obtained and verified? Yes   Other? No   Any new allergies since your discharge? No   Dietary orders reviewed? n/a  Do you have support at home? Yes   Functional Questionnaire: (I = Independent and D = Dependent) ADLs: I  Bathing/Dressing- I  Meal Prep- I  Eating- I  Maintaining continence- I  Transferring/Ambulation- I  Managing Meds- I   Follow up appointments reviewed:   PCP Hospital f/u appt confirmed? No    Specialist Hospital f/u appt confirmed? No  Scheduled to see Anibal Henderson, MD on 06/27/2020 @ 12:00pm.  Are transportation arrangements needed? No   If their condition worsens, is the pt aware to call PCP or go to the Emergency Dept.? Yes  Was the patient provided with contact information for the PCP's office or ED? Yes  Was to pt encouraged to call back with questions or concerns? Yes

## 2020-06-27 DIAGNOSIS — O039 Complete or unspecified spontaneous abortion without complication: Secondary | ICD-10-CM | POA: Diagnosis not present

## 2020-06-27 DIAGNOSIS — Z5189 Encounter for other specified aftercare: Secondary | ICD-10-CM | POA: Diagnosis not present

## 2020-07-17 DIAGNOSIS — B9689 Other specified bacterial agents as the cause of diseases classified elsewhere: Secondary | ICD-10-CM | POA: Diagnosis not present

## 2020-07-17 DIAGNOSIS — Z3491 Encounter for supervision of normal pregnancy, unspecified, first trimester: Secondary | ICD-10-CM | POA: Diagnosis not present

## 2020-07-17 DIAGNOSIS — N898 Other specified noninflammatory disorders of vagina: Secondary | ICD-10-CM | POA: Diagnosis not present

## 2020-07-17 DIAGNOSIS — N926 Irregular menstruation, unspecified: Secondary | ICD-10-CM | POA: Diagnosis not present

## 2020-07-17 DIAGNOSIS — N760 Acute vaginitis: Secondary | ICD-10-CM | POA: Diagnosis not present

## 2020-07-18 DIAGNOSIS — E669 Obesity, unspecified: Secondary | ICD-10-CM | POA: Diagnosis not present

## 2020-07-18 DIAGNOSIS — Z3169 Encounter for other general counseling and advice on procreation: Secondary | ICD-10-CM | POA: Diagnosis not present

## 2020-07-18 DIAGNOSIS — Z833 Family history of diabetes mellitus: Secondary | ICD-10-CM | POA: Diagnosis not present

## 2020-07-18 DIAGNOSIS — O09291 Supervision of pregnancy with other poor reproductive or obstetric history, first trimester: Secondary | ICD-10-CM | POA: Diagnosis not present

## 2020-07-18 DIAGNOSIS — G40901 Epilepsy, unspecified, not intractable, with status epilepticus: Secondary | ICD-10-CM | POA: Diagnosis not present

## 2020-07-19 DIAGNOSIS — Z3200 Encounter for pregnancy test, result unknown: Secondary | ICD-10-CM | POA: Diagnosis not present

## 2020-07-21 DIAGNOSIS — Z3201 Encounter for pregnancy test, result positive: Secondary | ICD-10-CM | POA: Diagnosis not present

## 2020-07-27 DIAGNOSIS — G40909 Epilepsy, unspecified, not intractable, without status epilepticus: Secondary | ICD-10-CM | POA: Diagnosis not present

## 2020-07-27 DIAGNOSIS — N926 Irregular menstruation, unspecified: Secondary | ICD-10-CM | POA: Diagnosis not present

## 2020-07-27 DIAGNOSIS — Z3201 Encounter for pregnancy test, result positive: Secondary | ICD-10-CM | POA: Diagnosis not present

## 2020-07-27 DIAGNOSIS — O09299 Supervision of pregnancy with other poor reproductive or obstetric history, unspecified trimester: Secondary | ICD-10-CM | POA: Diagnosis not present

## 2020-07-27 DIAGNOSIS — Z8751 Personal history of pre-term labor: Secondary | ICD-10-CM | POA: Diagnosis not present

## 2020-07-28 DIAGNOSIS — Z3201 Encounter for pregnancy test, result positive: Secondary | ICD-10-CM | POA: Diagnosis not present

## 2020-08-12 DIAGNOSIS — Z3481 Encounter for supervision of other normal pregnancy, first trimester: Secondary | ICD-10-CM | POA: Diagnosis not present

## 2020-08-12 DIAGNOSIS — O3680X0 Pregnancy with inconclusive fetal viability, not applicable or unspecified: Secondary | ICD-10-CM | POA: Diagnosis not present

## 2020-08-12 DIAGNOSIS — Z3A01 Less than 8 weeks gestation of pregnancy: Secondary | ICD-10-CM | POA: Diagnosis not present

## 2020-08-24 DIAGNOSIS — N390 Urinary tract infection, site not specified: Secondary | ICD-10-CM | POA: Diagnosis not present

## 2020-08-24 DIAGNOSIS — Z3A09 9 weeks gestation of pregnancy: Secondary | ICD-10-CM | POA: Diagnosis not present

## 2020-08-24 DIAGNOSIS — Z3490 Encounter for supervision of normal pregnancy, unspecified, unspecified trimester: Secondary | ICD-10-CM | POA: Diagnosis not present

## 2020-08-24 DIAGNOSIS — O0991 Supervision of high risk pregnancy, unspecified, first trimester: Secondary | ICD-10-CM | POA: Diagnosis not present

## 2020-08-24 DIAGNOSIS — Z113 Encounter for screening for infections with a predominantly sexual mode of transmission: Secondary | ICD-10-CM | POA: Diagnosis not present

## 2020-09-12 DIAGNOSIS — Z3A11 11 weeks gestation of pregnancy: Secondary | ICD-10-CM | POA: Diagnosis not present

## 2020-09-12 DIAGNOSIS — Z3682 Encounter for antenatal screening for nuchal translucency: Secondary | ICD-10-CM | POA: Diagnosis not present

## 2020-09-12 DIAGNOSIS — Z3481 Encounter for supervision of other normal pregnancy, first trimester: Secondary | ICD-10-CM | POA: Diagnosis not present

## 2020-09-13 DIAGNOSIS — Z3A12 12 weeks gestation of pregnancy: Secondary | ICD-10-CM | POA: Diagnosis not present

## 2020-09-13 DIAGNOSIS — Z36 Encounter for antenatal screening for chromosomal anomalies: Secondary | ICD-10-CM | POA: Diagnosis not present

## 2020-09-13 DIAGNOSIS — O3431 Maternal care for cervical incompetence, first trimester: Secondary | ICD-10-CM | POA: Diagnosis not present

## 2020-09-13 DIAGNOSIS — O99351 Diseases of the nervous system complicating pregnancy, first trimester: Secondary | ICD-10-CM | POA: Diagnosis not present

## 2020-09-13 DIAGNOSIS — G40909 Epilepsy, unspecified, not intractable, without status epilepticus: Secondary | ICD-10-CM | POA: Diagnosis not present

## 2020-09-13 DIAGNOSIS — O09291 Supervision of pregnancy with other poor reproductive or obstetric history, first trimester: Secondary | ICD-10-CM | POA: Diagnosis not present

## 2020-09-14 DIAGNOSIS — Z8669 Personal history of other diseases of the nervous system and sense organs: Secondary | ICD-10-CM | POA: Diagnosis not present

## 2020-09-14 DIAGNOSIS — O99111 Other diseases of the blood and blood-forming organs and certain disorders involving the immune mechanism complicating pregnancy, first trimester: Secondary | ICD-10-CM | POA: Diagnosis not present

## 2020-09-14 DIAGNOSIS — D696 Thrombocytopenia, unspecified: Secondary | ICD-10-CM | POA: Diagnosis not present

## 2020-09-20 DIAGNOSIS — E669 Obesity, unspecified: Secondary | ICD-10-CM | POA: Diagnosis not present

## 2020-09-20 DIAGNOSIS — Z3A13 13 weeks gestation of pregnancy: Secondary | ICD-10-CM | POA: Diagnosis not present

## 2020-09-20 DIAGNOSIS — O3431 Maternal care for cervical incompetence, first trimester: Secondary | ICD-10-CM | POA: Diagnosis not present

## 2020-09-20 DIAGNOSIS — Z6838 Body mass index (BMI) 38.0-38.9, adult: Secondary | ICD-10-CM | POA: Diagnosis not present

## 2020-09-20 DIAGNOSIS — O3432 Maternal care for cervical incompetence, second trimester: Secondary | ICD-10-CM | POA: Diagnosis not present

## 2020-09-20 DIAGNOSIS — O09292 Supervision of pregnancy with other poor reproductive or obstetric history, second trimester: Secondary | ICD-10-CM | POA: Diagnosis not present

## 2020-10-12 DIAGNOSIS — O09299 Supervision of pregnancy with other poor reproductive or obstetric history, unspecified trimester: Secondary | ICD-10-CM | POA: Diagnosis not present

## 2020-10-12 DIAGNOSIS — Z8759 Personal history of other complications of pregnancy, childbirth and the puerperium: Secondary | ICD-10-CM | POA: Diagnosis not present

## 2020-10-12 DIAGNOSIS — O09899 Supervision of other high risk pregnancies, unspecified trimester: Secondary | ICD-10-CM | POA: Diagnosis not present

## 2020-10-12 DIAGNOSIS — O99112 Other diseases of the blood and blood-forming organs and certain disorders involving the immune mechanism complicating pregnancy, second trimester: Secondary | ICD-10-CM | POA: Diagnosis not present

## 2020-10-12 DIAGNOSIS — O3432 Maternal care for cervical incompetence, second trimester: Secondary | ICD-10-CM | POA: Diagnosis not present

## 2020-10-12 DIAGNOSIS — D696 Thrombocytopenia, unspecified: Secondary | ICD-10-CM | POA: Diagnosis not present

## 2020-10-12 DIAGNOSIS — Z8669 Personal history of other diseases of the nervous system and sense organs: Secondary | ICD-10-CM | POA: Diagnosis not present

## 2020-10-12 DIAGNOSIS — O0992 Supervision of high risk pregnancy, unspecified, second trimester: Secondary | ICD-10-CM | POA: Diagnosis not present

## 2020-10-25 DIAGNOSIS — O3432 Maternal care for cervical incompetence, second trimester: Secondary | ICD-10-CM | POA: Diagnosis not present

## 2020-10-25 DIAGNOSIS — Z3A18 18 weeks gestation of pregnancy: Secondary | ICD-10-CM | POA: Diagnosis not present

## 2020-11-02 DIAGNOSIS — Z3A10 10 weeks gestation of pregnancy: Secondary | ICD-10-CM | POA: Diagnosis not present

## 2020-11-02 DIAGNOSIS — O200 Threatened abortion: Secondary | ICD-10-CM | POA: Diagnosis not present

## 2020-11-02 DIAGNOSIS — R109 Unspecified abdominal pain: Secondary | ICD-10-CM | POA: Diagnosis not present

## 2020-11-02 DIAGNOSIS — O2690 Pregnancy related conditions, unspecified, unspecified trimester: Secondary | ICD-10-CM | POA: Diagnosis not present

## 2020-11-02 DIAGNOSIS — O219 Vomiting of pregnancy, unspecified: Secondary | ICD-10-CM | POA: Diagnosis not present

## 2020-11-03 DIAGNOSIS — J302 Other seasonal allergic rhinitis: Secondary | ICD-10-CM | POA: Diagnosis not present

## 2020-11-03 DIAGNOSIS — Z803 Family history of malignant neoplasm of breast: Secondary | ICD-10-CM | POA: Diagnosis not present

## 2020-11-03 DIAGNOSIS — Z809 Family history of malignant neoplasm, unspecified: Secondary | ICD-10-CM | POA: Diagnosis not present

## 2020-11-03 DIAGNOSIS — B9620 Unspecified Escherichia coli [E. coli] as the cause of diseases classified elsewhere: Secondary | ICD-10-CM | POA: Diagnosis not present

## 2020-11-03 DIAGNOSIS — O3432 Maternal care for cervical incompetence, second trimester: Secondary | ICD-10-CM | POA: Diagnosis not present

## 2020-11-03 DIAGNOSIS — Z3A19 19 weeks gestation of pregnancy: Secondary | ICD-10-CM | POA: Diagnosis not present

## 2020-11-03 DIAGNOSIS — O99214 Obesity complicating childbirth: Secondary | ICD-10-CM | POA: Diagnosis not present

## 2020-11-03 DIAGNOSIS — Z8249 Family history of ischemic heart disease and other diseases of the circulatory system: Secondary | ICD-10-CM | POA: Diagnosis not present

## 2020-11-03 DIAGNOSIS — Z841 Family history of disorders of kidney and ureter: Secondary | ICD-10-CM | POA: Diagnosis not present

## 2020-11-03 DIAGNOSIS — A4151 Sepsis due to Escherichia coli [E. coli]: Secondary | ICD-10-CM | POA: Diagnosis not present

## 2020-11-03 DIAGNOSIS — O021 Missed abortion: Secondary | ICD-10-CM | POA: Diagnosis not present

## 2020-11-03 DIAGNOSIS — O321XX0 Maternal care for breech presentation, not applicable or unspecified: Secondary | ICD-10-CM | POA: Diagnosis not present

## 2020-11-03 DIAGNOSIS — Z833 Family history of diabetes mellitus: Secondary | ICD-10-CM | POA: Diagnosis not present

## 2020-11-03 DIAGNOSIS — O42912 Preterm premature rupture of membranes, unspecified as to length of time between rupture and onset of labor, second trimester: Secondary | ICD-10-CM | POA: Diagnosis not present

## 2020-11-03 DIAGNOSIS — O0882 Sepsis following ectopic and molar pregnancy: Secondary | ICD-10-CM | POA: Diagnosis not present

## 2020-11-03 DIAGNOSIS — Z8669 Personal history of other diseases of the nervous system and sense organs: Secondary | ICD-10-CM | POA: Diagnosis not present

## 2020-11-03 DIAGNOSIS — Z3482 Encounter for supervision of other normal pregnancy, second trimester: Secondary | ICD-10-CM | POA: Diagnosis not present

## 2020-11-05 DIAGNOSIS — O3432 Maternal care for cervical incompetence, second trimester: Secondary | ICD-10-CM | POA: Diagnosis not present

## 2020-11-05 DIAGNOSIS — O42912 Preterm premature rupture of membranes, unspecified as to length of time between rupture and onset of labor, second trimester: Secondary | ICD-10-CM | POA: Diagnosis not present

## 2020-11-05 DIAGNOSIS — Z3A19 19 weeks gestation of pregnancy: Secondary | ICD-10-CM | POA: Diagnosis not present

## 2020-11-05 DIAGNOSIS — G40901 Epilepsy, unspecified, not intractable, with status epilepticus: Secondary | ICD-10-CM | POA: Diagnosis not present

## 2020-11-05 DIAGNOSIS — O99352 Diseases of the nervous system complicating pregnancy, second trimester: Secondary | ICD-10-CM | POA: Diagnosis not present

## 2020-11-07 DIAGNOSIS — Z3A19 19 weeks gestation of pregnancy: Secondary | ICD-10-CM | POA: Diagnosis not present

## 2020-11-07 DIAGNOSIS — O3432 Maternal care for cervical incompetence, second trimester: Secondary | ICD-10-CM | POA: Diagnosis not present

## 2020-11-08 DIAGNOSIS — B9620 Unspecified Escherichia coli [E. coli] as the cause of diseases classified elsewhere: Secondary | ICD-10-CM | POA: Diagnosis not present

## 2020-11-08 DIAGNOSIS — Z3A19 19 weeks gestation of pregnancy: Secondary | ICD-10-CM | POA: Diagnosis not present

## 2020-11-08 DIAGNOSIS — R7881 Bacteremia: Secondary | ICD-10-CM | POA: Diagnosis not present

## 2020-11-09 DIAGNOSIS — Z3A19 19 weeks gestation of pregnancy: Secondary | ICD-10-CM | POA: Diagnosis not present

## 2020-11-09 DIAGNOSIS — R7881 Bacteremia: Secondary | ICD-10-CM | POA: Diagnosis not present

## 2020-11-09 DIAGNOSIS — B9620 Unspecified Escherichia coli [E. coli] as the cause of diseases classified elsewhere: Secondary | ICD-10-CM | POA: Diagnosis not present

## 2020-11-10 DIAGNOSIS — N858 Other specified noninflammatory disorders of uterus: Secondary | ICD-10-CM | POA: Diagnosis not present

## 2020-11-10 DIAGNOSIS — O43892 Other placental disorders, second trimester: Secondary | ICD-10-CM | POA: Diagnosis not present

## 2020-11-10 DIAGNOSIS — O42912 Preterm premature rupture of membranes, unspecified as to length of time between rupture and onset of labor, second trimester: Secondary | ICD-10-CM | POA: Diagnosis not present

## 2020-11-10 DIAGNOSIS — O411220 Chorioamnionitis, second trimester, not applicable or unspecified: Secondary | ICD-10-CM | POA: Diagnosis not present

## 2021-02-01 DIAGNOSIS — S79921A Unspecified injury of right thigh, initial encounter: Secondary | ICD-10-CM | POA: Diagnosis not present

## 2021-02-01 DIAGNOSIS — M79651 Pain in right thigh: Secondary | ICD-10-CM | POA: Diagnosis not present

## 2021-02-01 DIAGNOSIS — S3991XA Unspecified injury of abdomen, initial encounter: Secondary | ICD-10-CM | POA: Diagnosis not present

## 2021-02-01 DIAGNOSIS — R109 Unspecified abdominal pain: Secondary | ICD-10-CM | POA: Diagnosis not present

## 2021-02-01 DIAGNOSIS — R102 Pelvic and perineal pain: Secondary | ICD-10-CM | POA: Diagnosis not present

## 2021-02-01 DIAGNOSIS — M79605 Pain in left leg: Secondary | ICD-10-CM | POA: Diagnosis not present

## 2021-02-01 DIAGNOSIS — R1031 Right lower quadrant pain: Secondary | ICD-10-CM | POA: Diagnosis not present

## 2021-02-01 DIAGNOSIS — R079 Chest pain, unspecified: Secondary | ICD-10-CM | POA: Diagnosis not present

## 2021-02-01 DIAGNOSIS — S0993XA Unspecified injury of face, initial encounter: Secondary | ICD-10-CM | POA: Diagnosis not present

## 2021-02-01 DIAGNOSIS — S3993XA Unspecified injury of pelvis, initial encounter: Secondary | ICD-10-CM | POA: Diagnosis not present

## 2021-02-01 DIAGNOSIS — S299XXA Unspecified injury of thorax, initial encounter: Secondary | ICD-10-CM | POA: Diagnosis not present

## 2021-02-01 DIAGNOSIS — S0990XA Unspecified injury of head, initial encounter: Secondary | ICD-10-CM | POA: Diagnosis not present

## 2021-02-01 DIAGNOSIS — R519 Headache, unspecified: Secondary | ICD-10-CM | POA: Diagnosis not present

## 2021-02-01 DIAGNOSIS — R1032 Left lower quadrant pain: Secondary | ICD-10-CM | POA: Diagnosis not present

## 2021-06-21 DIAGNOSIS — N883 Incompetence of cervix uteri: Secondary | ICD-10-CM | POA: Diagnosis not present

## 2021-06-21 DIAGNOSIS — Z113 Encounter for screening for infections with a predominantly sexual mode of transmission: Secondary | ICD-10-CM | POA: Diagnosis not present

## 2021-06-21 DIAGNOSIS — N96 Recurrent pregnancy loss: Secondary | ICD-10-CM | POA: Diagnosis not present

## 2021-06-21 DIAGNOSIS — Z124 Encounter for screening for malignant neoplasm of cervix: Secondary | ICD-10-CM | POA: Diagnosis not present

## 2021-06-21 DIAGNOSIS — L680 Hirsutism: Secondary | ICD-10-CM | POA: Diagnosis not present

## 2021-06-21 DIAGNOSIS — Z803 Family history of malignant neoplasm of breast: Secondary | ICD-10-CM | POA: Diagnosis not present

## 2021-06-21 DIAGNOSIS — Z01411 Encounter for gynecological examination (general) (routine) with abnormal findings: Secondary | ICD-10-CM | POA: Diagnosis not present

## 2021-06-21 DIAGNOSIS — F4310 Post-traumatic stress disorder, unspecified: Secondary | ICD-10-CM | POA: Diagnosis not present

## 2021-06-24 DIAGNOSIS — R110 Nausea: Secondary | ICD-10-CM | POA: Diagnosis not present

## 2021-06-24 DIAGNOSIS — Z3491 Encounter for supervision of normal pregnancy, unspecified, first trimester: Secondary | ICD-10-CM | POA: Diagnosis not present

## 2021-06-24 DIAGNOSIS — N910 Primary amenorrhea: Secondary | ICD-10-CM | POA: Diagnosis not present

## 2021-07-11 ENCOUNTER — Encounter: Payer: Self-pay | Admitting: *Deleted

## 2021-07-27 DIAGNOSIS — F419 Anxiety disorder, unspecified: Secondary | ICD-10-CM | POA: Diagnosis not present

## 2021-07-27 DIAGNOSIS — F32A Depression, unspecified: Secondary | ICD-10-CM | POA: Diagnosis not present

## 2021-08-02 ENCOUNTER — Other Ambulatory Visit: Payer: Self-pay | Admitting: Obstetrics and Gynecology

## 2021-08-09 ENCOUNTER — Encounter (HOSPITAL_COMMUNITY): Admission: RE | Payer: Self-pay | Source: Ambulatory Visit

## 2021-08-09 ENCOUNTER — Ambulatory Visit (HOSPITAL_COMMUNITY)
Admission: RE | Admit: 2021-08-09 | Payer: No Typology Code available for payment source | Source: Ambulatory Visit | Admitting: Obstetrics and Gynecology

## 2021-08-09 SURGERY — CERCLAGE, CERVIX, LAPAROSCOPIC
Anesthesia: Choice

## 2022-02-09 DIAGNOSIS — Z3201 Encounter for pregnancy test, result positive: Secondary | ICD-10-CM | POA: Diagnosis not present

## 2022-02-14 DIAGNOSIS — N912 Amenorrhea, unspecified: Secondary | ICD-10-CM | POA: Diagnosis not present

## 2022-02-14 DIAGNOSIS — Z113 Encounter for screening for infections with a predominantly sexual mode of transmission: Secondary | ICD-10-CM | POA: Diagnosis not present

## 2022-02-14 DIAGNOSIS — Z3201 Encounter for pregnancy test, result positive: Secondary | ICD-10-CM | POA: Diagnosis not present

## 2022-02-20 DIAGNOSIS — H538 Other visual disturbances: Secondary | ICD-10-CM | POA: Diagnosis not present

## 2022-02-20 DIAGNOSIS — R519 Headache, unspecified: Secondary | ICD-10-CM | POA: Diagnosis not present

## 2022-03-02 DIAGNOSIS — O09299 Supervision of pregnancy with other poor reproductive or obstetric history, unspecified trimester: Secondary | ICD-10-CM | POA: Diagnosis not present

## 2022-03-02 DIAGNOSIS — O09291 Supervision of pregnancy with other poor reproductive or obstetric history, first trimester: Secondary | ICD-10-CM | POA: Diagnosis not present

## 2022-03-02 DIAGNOSIS — Z3490 Encounter for supervision of normal pregnancy, unspecified, unspecified trimester: Secondary | ICD-10-CM | POA: Diagnosis not present

## 2022-03-02 DIAGNOSIS — Z3A08 8 weeks gestation of pregnancy: Secondary | ICD-10-CM | POA: Diagnosis not present

## 2022-09-10 DIAGNOSIS — R109 Unspecified abdominal pain: Secondary | ICD-10-CM | POA: Diagnosis not present

## 2022-09-10 DIAGNOSIS — Z9889 Other specified postprocedural states: Secondary | ICD-10-CM | POA: Diagnosis not present

## 2023-08-13 DIAGNOSIS — R55 Syncope and collapse: Secondary | ICD-10-CM | POA: Diagnosis not present

## 2023-08-14 DIAGNOSIS — N912 Amenorrhea, unspecified: Secondary | ICD-10-CM | POA: Diagnosis not present

## 2023-08-14 DIAGNOSIS — Z87898 Personal history of other specified conditions: Secondary | ICD-10-CM | POA: Diagnosis not present

## 2023-09-04 DIAGNOSIS — D5720 Sickle-cell/Hb-C disease without crisis: Secondary | ICD-10-CM | POA: Diagnosis not present

## 2023-11-13 DIAGNOSIS — R000 Tachycardia, unspecified: Secondary | ICD-10-CM | POA: Diagnosis not present

## 2023-12-13 DIAGNOSIS — O3432 Maternal care for cervical incompetence, second trimester: Secondary | ICD-10-CM | POA: Diagnosis not present

## 2023-12-13 DIAGNOSIS — Z3A23 23 weeks gestation of pregnancy: Secondary | ICD-10-CM | POA: Diagnosis not present

## 2023-12-13 DIAGNOSIS — O99352 Diseases of the nervous system complicating pregnancy, second trimester: Secondary | ICD-10-CM | POA: Diagnosis not present
